# Patient Record
Sex: Female | Born: 1965 | Race: White | Hispanic: No | Smoking: Current every day smoker
Health system: Southern US, Community
[De-identification: ages and names within clinical notes are randomized; demographics above are authoritative.]

## PROBLEM LIST (undated history)

## (undated) DIAGNOSIS — G4700 Insomnia, unspecified: Secondary | ICD-10-CM

## (undated) DIAGNOSIS — I10 Essential (primary) hypertension: Secondary | ICD-10-CM

## (undated) DIAGNOSIS — F4310 Post-traumatic stress disorder, unspecified: Secondary | ICD-10-CM

## (undated) DIAGNOSIS — F603 Borderline personality disorder: Secondary | ICD-10-CM

## (undated) DIAGNOSIS — F419 Anxiety disorder, unspecified: Secondary | ICD-10-CM

## (undated) DIAGNOSIS — J45909 Unspecified asthma, uncomplicated: Secondary | ICD-10-CM

## (undated) DIAGNOSIS — F1910 Other psychoactive substance abuse, uncomplicated: Secondary | ICD-10-CM

## (undated) DIAGNOSIS — B1920 Unspecified viral hepatitis C without hepatic coma: Secondary | ICD-10-CM

## (undated) DIAGNOSIS — F319 Bipolar disorder, unspecified: Secondary | ICD-10-CM

## (undated) HISTORY — PX: TUBAL LIGATION: SHX77

## (undated) HISTORY — DX: Essential (primary) hypertension: I10

---

## 1998-09-24 ENCOUNTER — Emergency Department (HOSPITAL_COMMUNITY): Admission: EM | Admit: 1998-09-24 | Discharge: 1998-09-24 | Payer: Self-pay | Admitting: Emergency Medicine

## 1999-01-18 ENCOUNTER — Emergency Department (HOSPITAL_COMMUNITY): Admission: EM | Admit: 1999-01-18 | Discharge: 1999-01-18 | Payer: Self-pay | Admitting: Emergency Medicine

## 1999-04-10 ENCOUNTER — Emergency Department (HOSPITAL_COMMUNITY): Admission: EM | Admit: 1999-04-10 | Discharge: 1999-04-10 | Payer: Self-pay | Admitting: Emergency Medicine

## 1999-04-12 ENCOUNTER — Emergency Department (HOSPITAL_COMMUNITY): Admission: EM | Admit: 1999-04-12 | Discharge: 1999-04-12 | Payer: Self-pay | Admitting: Emergency Medicine

## 1999-07-11 ENCOUNTER — Emergency Department (HOSPITAL_COMMUNITY): Admission: EM | Admit: 1999-07-11 | Discharge: 1999-07-11 | Payer: Self-pay | Admitting: Emergency Medicine

## 2000-10-28 ENCOUNTER — Emergency Department (HOSPITAL_COMMUNITY): Admission: EM | Admit: 2000-10-28 | Discharge: 2000-10-28 | Payer: Self-pay | Admitting: Emergency Medicine

## 2001-07-24 ENCOUNTER — Other Ambulatory Visit: Admission: RE | Admit: 2001-07-24 | Discharge: 2001-07-24 | Payer: Self-pay | Admitting: Obstetrics and Gynecology

## 2001-11-15 ENCOUNTER — Emergency Department (HOSPITAL_COMMUNITY): Admission: EM | Admit: 2001-11-15 | Discharge: 2001-11-15 | Payer: Self-pay | Admitting: Emergency Medicine

## 2001-11-19 ENCOUNTER — Emergency Department (HOSPITAL_COMMUNITY): Admission: EM | Admit: 2001-11-19 | Discharge: 2001-11-19 | Payer: Self-pay | Admitting: Emergency Medicine

## 2001-11-19 ENCOUNTER — Encounter: Payer: Self-pay | Admitting: Emergency Medicine

## 2003-06-06 ENCOUNTER — Emergency Department (HOSPITAL_COMMUNITY): Admission: EM | Admit: 2003-06-06 | Discharge: 2003-06-06 | Payer: Self-pay

## 2003-10-23 ENCOUNTER — Emergency Department (HOSPITAL_COMMUNITY): Admission: EM | Admit: 2003-10-23 | Discharge: 2003-10-23 | Payer: Self-pay | Admitting: Internal Medicine

## 2006-05-12 ENCOUNTER — Emergency Department (HOSPITAL_COMMUNITY): Admission: EM | Admit: 2006-05-12 | Discharge: 2006-05-12 | Payer: Self-pay | Admitting: *Deleted

## 2011-04-25 ENCOUNTER — Emergency Department (HOSPITAL_COMMUNITY): Payer: Self-pay

## 2011-04-25 ENCOUNTER — Emergency Department (HOSPITAL_COMMUNITY)
Admission: EM | Admit: 2011-04-25 | Discharge: 2011-04-25 | Disposition: A | Discharge status: Home / Self Care | Payer: No Typology Code available for payment source | Attending: Emergency Medicine | Admitting: Emergency Medicine

## 2011-04-25 DIAGNOSIS — M546 Pain in thoracic spine: Secondary | ICD-10-CM | POA: Insufficient documentation

## 2011-04-25 DIAGNOSIS — M25559 Pain in unspecified hip: Secondary | ICD-10-CM | POA: Insufficient documentation

## 2011-04-25 DIAGNOSIS — S0990XA Unspecified injury of head, initial encounter: Secondary | ICD-10-CM | POA: Insufficient documentation

## 2011-04-25 DIAGNOSIS — M545 Low back pain, unspecified: Secondary | ICD-10-CM | POA: Insufficient documentation

## 2011-04-25 DIAGNOSIS — S46909A Unspecified injury of unspecified muscle, fascia and tendon at shoulder and upper arm level, unspecified arm, initial encounter: Secondary | ICD-10-CM | POA: Insufficient documentation

## 2011-04-25 DIAGNOSIS — M542 Cervicalgia: Secondary | ICD-10-CM | POA: Insufficient documentation

## 2011-04-25 DIAGNOSIS — M25519 Pain in unspecified shoulder: Secondary | ICD-10-CM | POA: Insufficient documentation

## 2011-04-25 DIAGNOSIS — IMO0002 Reserved for concepts with insufficient information to code with codable children: Secondary | ICD-10-CM | POA: Insufficient documentation

## 2011-04-25 DIAGNOSIS — S4980XA Other specified injuries of shoulder and upper arm, unspecified arm, initial encounter: Secondary | ICD-10-CM | POA: Insufficient documentation

## 2011-04-25 LAB — CBC
HCT: 37.1 % (ref 36.0–46.0)
Hemoglobin: 12.5 g/dL (ref 12.0–15.0)
MCH: 29.9 pg (ref 26.0–34.0)
MCV: 88.8 fL (ref 78.0–100.0)
Platelets: 283 10*3/uL (ref 150–400)
WBC: 9.5 10*3/uL (ref 4.0–10.5)

## 2011-04-25 LAB — DIFFERENTIAL
Basophils Absolute: 0.1 10*3/uL (ref 0.0–0.1)
Eosinophils Absolute: 0.5 10*3/uL (ref 0.0–0.7)
Lymphocytes Relative: 17 % (ref 12–46)
Lymphs Abs: 1.6 10*3/uL (ref 0.7–4.0)
Neutrophils Relative %: 71 % (ref 43–77)

## 2011-04-25 LAB — POCT PREGNANCY, URINE: Preg Test, Ur: NEGATIVE

## 2011-04-25 LAB — RAPID URINE DRUG SCREEN, HOSP PERFORMED: Tetrahydrocannabinol: NOT DETECTED

## 2011-04-25 LAB — BASIC METABOLIC PANEL
BUN: 17 mg/dL (ref 6–23)
Calcium: 9 mg/dL (ref 8.4–10.5)
Chloride: 104 mEq/L (ref 96–112)
Creatinine, Ser: 0.88 mg/dL (ref 0.4–1.2)
GFR calc Af Amer: >60 mL/min (ref >60)
GFR calc non Af Amer: >60 mL/min (ref >60)
Potassium: 3.7 mEq/L (ref 3.5–5.1)

## 2011-04-25 LAB — ETHANOL: Alcohol, Ethyl (B): <11 mg/dL — ABNORMAL HIGH (ref 0–10)

## 2012-06-07 ENCOUNTER — Emergency Department (HOSPITAL_COMMUNITY)
Admission: EM | Admit: 2012-06-07 | Discharge: 2012-06-07 | Disposition: A | Discharge status: Home / Self Care | Payer: Self-pay | Attending: Emergency Medicine | Admitting: Emergency Medicine

## 2012-06-07 ENCOUNTER — Encounter (HOSPITAL_COMMUNITY): Payer: Self-pay | Admitting: Emergency Medicine

## 2012-06-07 DIAGNOSIS — G4700 Insomnia, unspecified: Secondary | ICD-10-CM | POA: Insufficient documentation

## 2012-06-07 DIAGNOSIS — R21 Rash and other nonspecific skin eruption: Secondary | ICD-10-CM | POA: Insufficient documentation

## 2012-06-07 DIAGNOSIS — F4310 Post-traumatic stress disorder, unspecified: Secondary | ICD-10-CM | POA: Insufficient documentation

## 2012-06-07 DIAGNOSIS — F17200 Nicotine dependence, unspecified, uncomplicated: Secondary | ICD-10-CM | POA: Insufficient documentation

## 2012-06-07 HISTORY — DX: Post-traumatic stress disorder, unspecified: F43.10

## 2012-06-07 HISTORY — DX: Insomnia, unspecified: G47.00

## 2012-06-07 HISTORY — DX: Borderline personality disorder: F60.3

## 2012-06-07 NOTE — ED Notes (Signed)
Pt states stepped on broken light bulb a few weeks ago. Felt that she got all of it out. Pt states blister formed and she popped it. Pt states today has tiny bumps around area and starting on her left great toe as well as on her other foot. Denies pain or itching.

## 2012-06-07 NOTE — ED Notes (Signed)
Patient with no complaints at this time. Respirations even and unlabored. Skin warm/dry. Discharge instructions reviewed with patient at this time. Patient given opportunity to voice concerns/ask questions. Patient discharged at this time and left Emergency Department with steady gait.   

## 2012-06-07 NOTE — ED Provider Notes (Signed)
History   This chart was scribed for Joya Gaskins, MD by Clarita Crane. The patient was seen in room APA17/APA17. Patient's care was started at 0724.    CSN: [id number]  Arrival date & time 06/07/12  4098   First MD Initiated Contact with Patient 06/07/12 [phone]      Chief Complaint  Patient presents with  . Blister  . Rash   HPI Kelly Kelley is a 46 y.o. female who presents to the Emergency Department complaining of multiple moderate blisters to sole of left foot onset several weeks ago and persistent since. Patient notes first blister appeared several days after stepping on a piece of glass from a broken light bulb with a white powder on the inside surface of the bulb and several similar blisters have formed since. Patient states glass was removed immediately after stepping upon it. Denies nausea, vomiting, fever, chills, weakness, chest pain, SOB  Past Medical History  Diagnosis Date  . PTSD (post-traumatic stress disorder)   . Insomnia   . Borderline personality disorder     Past Surgical History  Procedure Date  . Tubal ligation   . Cesarean section     History reviewed. No pertinent family history.  History  Substance Use Topics  . Smoking status: Current Everyday Smoker -- 1.0 packs/day  . Smokeless tobacco: Not on file  . Alcohol Use: Yes     daily until three days ago. pt reports 6 beers a day    OB History    Grav Para Term Preterm Abortions TAB SAB Ect Mult Living                  Review of Systems  Constitutional: Negative for fever and chills.  Gastrointestinal: Negative for nausea and vomiting.  Skin: Positive for rash. Negative for color change.       +blisters to sole of left foot    Allergies  Penicillins  Home Medications  No current outpatient prescriptions on file.  LMP 06/07/2012  BP 145/94  Pulse 92  Temp 98.1 F (36.7 C) (Oral)  Resp 18  SpO2 99%  LMP 06/07/2012   Physical Exam CONSTITUTIONAL: Well developed/well  nourished HEAD AND FACE: Normocephalic/atraumatic EYES: EOMI/PERRL ENMT: Mucous membranes moist NECK: supple no meningeal signs CV: S1/S2 noted, no murmurs/rubs/gallops noted LUNGS: Lungs are clear to auscultation bilaterally, no apparent distress ABDOMEN: soft, nontender, no rebound or guarding NEURO: Pt is awake/alert, moves all extremitiesx4 EXTREMITIES: pulses normal, full ROM SKIN: warm, color normal, few scattered papules to sole of left foot with no increased warmth or erythema noted No crepitance.  No streaking.  No petechiae noted PSYCH: no abnormalities of mood noted  ED Course  Procedures  DIAGNOSTIC STUDIES:  COORDINATION OF CARE: 7:37AM-Patient informed of current plan for treatment and evaluation and agrees with plan at this time.  No signs of retained glass in foot Pt is ambulatory without difficulty She is well appearing Advised derm followup if not improved with OTC topical meds     1. Rash       MDM  Nursing notes including past medical history and social history reviewed and considered in documentation .      I personally performed the services described in this documentation, which was scribed in my presence. The recorded information has been reviewed and considered.      Joya Gaskins, MD 06/07/12 [phone]

## 2015-03-18 ENCOUNTER — Ambulatory Visit: Payer: Self-pay | Attending: Internal Medicine

## 2015-07-22 ENCOUNTER — Emergency Department (HOSPITAL_BASED_OUTPATIENT_CLINIC_OR_DEPARTMENT_OTHER)
Admission: EM | Admit: 2015-07-22 | Discharge: 2015-07-22 | Disposition: A | Discharge status: Home / Self Care | Payer: Self-pay | Attending: Physician Assistant | Admitting: Physician Assistant

## 2015-07-22 ENCOUNTER — Encounter (HOSPITAL_BASED_OUTPATIENT_CLINIC_OR_DEPARTMENT_OTHER): Payer: Self-pay

## 2015-07-22 ENCOUNTER — Emergency Department (HOSPITAL_BASED_OUTPATIENT_CLINIC_OR_DEPARTMENT_OTHER): Payer: Self-pay

## 2015-07-22 DIAGNOSIS — F17200 Nicotine dependence, unspecified, uncomplicated: Secondary | ICD-10-CM

## 2015-07-22 DIAGNOSIS — F603 Borderline personality disorder: Secondary | ICD-10-CM | POA: Insufficient documentation

## 2015-07-22 DIAGNOSIS — G4700 Insomnia, unspecified: Secondary | ICD-10-CM | POA: Insufficient documentation

## 2015-07-22 DIAGNOSIS — R0602 Shortness of breath: Secondary | ICD-10-CM | POA: Insufficient documentation

## 2015-07-22 DIAGNOSIS — I10 Essential (primary) hypertension: Secondary | ICD-10-CM | POA: Insufficient documentation

## 2015-07-22 DIAGNOSIS — Z720 Tobacco use: Secondary | ICD-10-CM | POA: Insufficient documentation

## 2015-07-22 DIAGNOSIS — F4310 Post-traumatic stress disorder, unspecified: Secondary | ICD-10-CM | POA: Insufficient documentation

## 2015-07-22 DIAGNOSIS — F319 Bipolar disorder, unspecified: Secondary | ICD-10-CM | POA: Insufficient documentation

## 2015-07-22 HISTORY — DX: Bipolar disorder, unspecified: F31.9

## 2015-07-22 HISTORY — DX: Other psychoactive substance abuse, uncomplicated: F19.10

## 2015-07-22 HISTORY — DX: Anxiety disorder, unspecified: F41.9

## 2015-07-22 LAB — BASIC METABOLIC PANEL
ANION GAP: 8 (ref 5–15)
BUN: 12 mg/dL (ref 6–20)
CALCIUM: 9.3 mg/dL (ref 8.9–10.3)
CHLORIDE: 106 mmol/L (ref 101–111)
CO2: 27 mmol/L (ref 22–32)
Creatinine, Ser: 0.86 mg/dL (ref 0.44–1.00)
GFR calc Af Amer: >60 mL/min (ref >60)
GLUCOSE: 136 mg/dL — AB (ref 65–99)
POTASSIUM: 3.6 mmol/L (ref 3.5–5.1)
Sodium: 141 mmol/L (ref 135–145)

## 2015-07-22 LAB — CBC WITH DIFFERENTIAL/PLATELET
BASOS ABS: 0 10*3/uL (ref 0.0–0.1)
Basophils Relative: 0 % (ref 0–1)
Eosinophils Absolute: 0.3 10*3/uL (ref 0.0–0.7)
Eosinophils Relative: 4 % (ref 0–5)
HEMATOCRIT: 39.1 % (ref 36.0–46.0)
HEMOGLOBIN: 13.8 g/dL (ref 12.0–15.0)
LYMPHS PCT: 26 % (ref 12–46)
Lymphs Abs: 2.2 10*3/uL (ref 0.7–4.0)
MCH: 30.4 pg (ref 26.0–34.0)
MCHC: 35.3 g/dL (ref 30.0–36.0)
MCV: 86.1 fL (ref 78.0–100.0)
Monocytes Absolute: 0.5 10*3/uL (ref 0.1–1.0)
Monocytes Relative: 6 % (ref 3–12)
Neutro Abs: 5.4 10*3/uL (ref 1.7–7.7)
Neutrophils Relative %: 64 % (ref 43–77)
Platelets: 195 10*3/uL (ref 150–400)
RBC: 4.54 MIL/uL (ref 3.87–5.11)
RDW: 12.8 % (ref 11.5–15.5)
WBC: 8.5 10*3/uL (ref 4.0–10.5)

## 2015-07-22 LAB — URINALYSIS, ROUTINE W REFLEX MICROSCOPIC
BILIRUBIN URINE: NEGATIVE
Glucose, UA: NEGATIVE mg/dL
Hgb urine dipstick: NEGATIVE
KETONES UR: NEGATIVE mg/dL
LEUKOCYTES UA: NEGATIVE
Nitrite: NEGATIVE
PH: 6.5 (ref 5.0–8.0)
Protein, ur: NEGATIVE mg/dL
SPECIFIC GRAVITY, URINE: 1.005 (ref 1.005–1.030)
UROBILINOGEN UA: 0.2 mg/dL (ref 0.0–1.0)

## 2015-07-22 MED ORDER — HYDROCHLOROTHIAZIDE 12.5 MG PO CAPS: 12.5000 mg | ORAL_CAPSULE | Freq: Every day | ORAL | Status: DC

## 2015-07-22 NOTE — ED Notes (Signed)
Right arm was 168/120. Checked both arms due to reading.

## 2015-07-22 NOTE — ED Notes (Signed)
Pa  at bedside. 

## 2015-07-22 NOTE — ED Notes (Signed)
Pt sent from [facility] for elevated BP-Day one at [facility] for cocaine use-written report reads 157/121 at 1245pm and 169/111 at 1145am-pt NAD-denies pain except "carpal tunnel"

## 2015-07-22 NOTE — ED Provider Notes (Signed)
CSN: [id number]     Arrival date & time 07/22/15  1303 History   First MD Initiated Contact with Patient 07/22/15 1317     Chief Complaint  Patient presents with  . Hypertension     (Consider location/radiation/quality/duration/timing/severity/associated sxs/prior Treatment) HPI   Blood pressure 160/100, pulse 78, temperature 98.7 F (37.1 C), temperature source Oral, resp. rate 18, height 5' (1.524 m), weight 149 lb (67.586 kg), SpO2 98 %.  Kelly Kelley is a 49 y.o. female sent from [place] for evaluated of elevated blood pressure. Patient last used cocaine on June 15. She states that she has a dyspnea on exertion with no chest pain, cough, fever, chills, headache, change in vision, dysarthria, ataxia, abdominal pain, nausea, vomiting or change in bowel or bladder habits. Patient is uninsured with no primary care physician should never had a diagnosis of high blood pressure however she states that it runs in her family.  Past Medical History  Diagnosis Date  . PTSD (post-traumatic stress disorder)   . Insomnia   . Borderline personality disorder   . Substance abuse   . Bipolar disorder   . Anxiety    Past Surgical History  Procedure Laterality Date  . Tubal ligation    . Cesarean section     No family history on file. History  Substance Use Topics  . Smoking status: Current Every Day Smoker -- 1.00 packs/day  . Smokeless tobacco: Not on file  . Alcohol Use: Yes     Comment: in rehab   OB History    No data available     Review of Systems  10 systems reviewed and found to be negative, except as noted in the HPI.  Allergies  Morphine and related and Penicillins  Home Medications   Prior to Admission medications   Medication Sig Start Date End Date Taking? Authorizing Provider  QUEtiapine Fumarate (SEROQUEL PO) Take by mouth.   Yes Historical Provider, MD   BP 164/107 mmHg  Pulse 78  Temp(Src) 98.7 F (37.1 C) (Oral)  Resp 18  Ht 5' (1.524 m)  Wt 149 lb  (67.586 kg)  BMI 29.10 kg/m2  SpO2 98% Physical Exam  Constitutional: She is oriented to person, place, and time. She appears well-developed and well-nourished. No distress.  HENT:  Head: Normocephalic.  Mouth/Throat: Oropharynx is clear and moist.  Eyes: Conjunctivae and EOM are normal. Pupils are equal, round, and reactive to light.  Neck: Normal range of motion. No JVD present. No tracheal deviation present.  Cardiovascular: Normal rate, regular rhythm and intact distal pulses.   Radial pulse equal bilaterally  Pulmonary/Chest: Effort normal and breath sounds normal. No stridor. No respiratory distress. She has no wheezes. She has no rales. She exhibits no tenderness.  Abdominal: Soft. She exhibits no distension and no mass. There is no tenderness. There is no rebound and no guarding.  Musculoskeletal: Normal range of motion. She exhibits no edema or tenderness.  No calf asymmetry, superficial collaterals, palpable cords, edema, Homans sign negative bilaterally.    Neurological: She is alert and oriented to person, place, and time.  Skin: Skin is warm. She is not diaphoretic.  Psychiatric: She has a normal mood and affect.  Nursing note and vitals reviewed.   ED Course  Procedures (including critical care time) Labs Review Labs Reviewed - No data to display  Imaging Review No results found.   EKG Interpretation None      MDM   Final diagnoses:  SOB (shortness of  breath)  Tobacco use disorder  HTN (hypertension) with goal to be determined    Filed Vitals:   07/22/15 1309 07/22/15 1322  BP: 164/107 160/100  Pulse: 78   Temp: 98.7 F (37.1 C)   TempSrc: Oral   Resp: 18   Height: 5' (1.524 m)   Weight: 149 lb (67.586 kg)   SpO2: 98%      Kelly Kelley is a pleasant 49 y.o. female presenting with a symptom at hypertension. Patient states she short of breath but I think this is just secondary to her from smoking. Because patient is uninsured, without  follow-up, will do basic blood work, chest check EKG and chest x-ray and start her on hypertension medications, we've had an extensive discussion patient understands she will have to follow-up for blood pressure recheck in the next month. Blood work, chest x-ray and EKG without acute abnormality.  I've given her a resource guide as well. We've had extensive discussion of return precautions and I've counseled her on smoking cessation.  Evaluation does not show pathology that would require ongoing emergent intervention or inpatient treatment. Pt is hemodynamically stable and mentating appropriately. Discussed findings and plan with patient/guardian, who agrees with care plan. All questions answered. Return precautions discussed and outpatient follow up given.   Discharge Medication List as of 07/22/2015  2:26 PM    START taking these medications   Details  hydrochlorothiazide (MICROZIDE) 12.5 MG capsule Take 1 capsule (12.5 mg total) by mouth daily., Starting 07/22/2015, Until Discontinued, Print             Wynetta Emery, PA-C 07/22/15 1508  Courteney Randall An, MD 07/22/15 1536

## 2015-07-22 NOTE — Discharge Instructions (Signed)
Do not hesitate to return to the emergency room for any new, worsening or concerning symptoms. ° °Please obtain primary care using resource guide below. Let them know that you were seen in the emergency room and that they will need to obtain records for further outpatient management. ° ° ° °Emergency Department Resource Guide °1) Find a Doctor and Pay Out of Pocket °Although you won't have to find out who is covered by your insurance plan, it is a good idea to ask around and get recommendations. You will then need to call the office and see if the doctor you have chosen will accept you as a new patient and what types of options they offer for patients who are self-pay. Some doctors offer discounts or will set up payment plans for their patients who do not have insurance, but you will need to ask so you aren't surprised when you get to your appointment. ° °2) Contact Your Local Health Department °Not all health departments have doctors that can see patients for sick visits, but many do, so it is worth a call to see if yours does. If you don't know where your local health department is, you can check in your phone book. The CDC also has a tool to help you locate your state's health department, and many state websites also have listings of all of their local health departments. ° °3) Find a Walk-in Clinic °If your illness is not likely to be very severe or complicated, you may want to try a walk in clinic. These are popping up all over the country in pharmacies, drugstores, and shopping centers. They're usually staffed by nurse practitioners or physician assistants that have been trained to treat common illnesses and complaints. They're usually fairly quick and inexpensive. However, if you have serious medical issues or chronic medical problems, these are probably not your best option. ° °No Primary Care Doctor: °- Call Health Connect at  832-8000 - they can help you locate a primary care doctor that  accepts your  insurance, provides certain services, etc. °- Physician Referral Service- 1-800-533-3463 ° °Chronic Pain Problems: °Organization         Address  Phone   Notes  °[facility]  (336) 297-2271 Patients need to be referred by their primary care doctor.  ° °Medication Assistance: °Organization         Address  Phone   Notes  °[place] [facility] [address] °[place], [place] [address] (336) 641-8030 --Must be a resident of [place] °-- Must have NO insurance coverage whatsoever (no Medicaid/ Medicare, etc.) °-- The pt. MUST have a primary care doctor that directs their care regularly and follows them in the community °  °[facility]  (866) 331-1348   °[employer] Way  (888) 892-1162   ° °Agencies that provide inexpensive medical care: °Organization         Address  Phone   Notes  °[facility]  (336) 832-8035   °[facility]    (336) 832-7272   °[facility] [address] °[place], [place] [address] (336) 832-4777   °[facility] [address], °[place] (336) 271-4999   °[facility]    (336) 373-0678   °[facility]    (336) 272-1050   °[facility] ° [address],  Phone:  (336) 832-4444, Fax:  (336) 832-4440 Hours of Operation:  9 am -   6 pm, M-F.  Also accepts Medicaid/Medicare and self-pay.  °[facility] ° [address], [place] Phone: (336) 832-3150, Fax: (336) 832-3151. Hours of Operation:  8:30 am - 5:30 pm, M-F.  Also accepts Medicaid and self-pay.  °[facility] [place] [address], [place] Phone: (336) 878-6027   °[facility] [address], [place], [place] (336)723-1848, Ext. 123 Mondays & Thursdays: 7-9 AM.  First 15 patients are seen on a first come, first serve basis. °  ° °Medicaid-accepting [place] Providers: ° °Organization          Address  Phone   Notes  °[facility] [address], [place] (336) 641-2100 Also accepts self-pay patients.  °[facility] [address], [place] ° (336) 856-9996   °[facility] [address], [place] (336) 288-8857   °[facility] [address] [place] [address], [place] (336) 299-7000   °Veita Bland [address], [place]  ° (336) 373-1557 Only accepts [place] Access Medicaid patients after they have their name applied to their card.  ° °Self-Pay (no insurance) in [place]: ° °Organization         Address  Phone   Notes  °Sickle Cell Patients, [facility] [address], [place] (336) 832-1970   °[facility] [address], [place] (336) 832-4400   °[facility] [place] ° [address], [place] (336) 992-4800   °[facility]/Dr. Osei-Bonsu ° [address] [place] [address], [place] or [address], [place] (336) 841-8500 Phone number for both [place] and [place] locations is the same.  °[facility] [address], [place] (336) 299-0000   °[facility] [address] [place] [address], [place] or [address] (336) 852-7530 °(336) 878-2260   °[facility] [address], [place] (336) 350-1642, phone; (336) 294-5005, fax Sees patients 1st and 3rd Saturday of every month.  Must not qualify for public or private insurance (i.e. Medicaid, Medicare, [place] Health Choice, Veterans' Benefits) • Household income should be no more than 200% of the poverty level •The clinic cannot treat you if you are pregnant or think you are pregnant • Sexually transmitted diseases are not treated at the clinic.  ° ° °Dental Care: °Organization         Address  Phone  Notes  °[facility] [address],  [place] (336) 641-6152 Accepts children up to age 21 who are enrolled in Medicaid or [place] Health Choice; pregnant women with a Medicaid card; and children who have applied for Medicaid or [place] Health Choice, but were declined, whose parents can pay a reduced fee at time of service.  °[facility] [place]  [address], [place] (336) 641-7733 Accepts children up to age 21 who are enrolled in Medicaid or [place] Health Choice; pregnant women with a Medicaid card; and children who have applied for Medicaid or [place] Health Choice, but were declined, whose parents can pay a reduced fee at time of service.  °[facility] ° [address],  (336) 641-4533 Patients are seen by appointment only. Walk-ins are not accepted. [facility] will see patients 18 years of age and   older. °Monday - Tuesday (8am-5pm) °Most Wednesdays (8:30-5pm) °$30 per visit, cash only  °[facility] ° [address], [place] (336) 641-4533 Patients are seen by appointment only. Walk-ins are not accepted. [facility] will see patients 18 years of age and older. °One Wednesday Evening (Monthly: Volunteer Based).  $30 per visit, cash only  °[facility]  (919) 537-3737 for adults; Children under age 4, call Graduate Pediatric Dentistry at (919) 537-3956. Children aged 4-14, please call (919) 537-3737 to request a pediatric application. ° Dental services are provided in all areas of dental care including fillings, crowns and bridges, complete and partial dentures, implants, gum treatment, root canals, and extractions. Preventive care is also provided. Treatment is provided to both adults and children. °Patients are selected via a lottery and there is often a waiting list. °  °[facility] [address], °[place] ° (336) 763-8833 [email or web address] °  °[facility] [address], [place], [place]  (336)723-1848, Ext. 123 Second and Fourth Thursday of each month, opens at 6:30 AM; Clinic ends at 9 AM.  Patients are seen on a first-come first-served basis, and a limited number are seen during each clinic.  ° °[facility] ° [address], [place], [place] (336) 723-7904   Eligibility Requirements °You must have lived in [place], [place], or [place] counties for at least the last three months. °  You cannot be eligible for state or federal sponsored healthcare insurance, including Veterans Administration, Medicaid, or Medicare. °  You generally cannot be eligible for healthcare insurance through your employer.  °  How to apply: °Eligibility screenings are held every Tuesday and Wednesday afternoon from 1:00 pm until 4:00 pm. You do not need an appointment for the interview!  °[facility] [address], [place], [place] 336-631-2330   °[facility] [facility]  336-342-8273   °[facility]  336-703-3100   °[facility]  336-570-6415   ° °Behavioral Health Resources in the Community: °Intensive Outpatient Programs °Organization         Address  Phone  Notes  °[facility] [address], [place], [place] 336-878-6098   °[facility] [address], [place], [place] 336-832-9800   °[facility] [address], [place], [place] ° 336-882-2125   °[facility] [address],  °[place], [place] 1-800-853-5163 or 336-641-4981   °Substance Abuse Resources °Organization         Address  Phone  Notes  °[facility]  336-882-2125   °[facility]  336-784-9470   °[facility]  336-285-9073   °[facility]  336-845-3988   °Residential & Outpatient Substance Abuse Program  1-800-659-3381   °Psychological Services °Organization         Address  Phone  Notes  °[facility]  336- 832-9600   °[facility]  336- 378-7881    °[facility] [address], [place] 1-800-853-5163 or 336-641-4981   ° °Mobile Crisis Teams °Organization         Address  Phone  Notes  °[facility], Mobile Crisis Care Unit  1-877-626-1772   °Assertive °[facility] ° [address] [place], [place] 336-834-9664   °Kaile DeEsch [address] °[place] 336-554-5454   ° °Self-Help/Support Groups °Organization         Address    Phone             Notes  °[facility] - variety of support groups  336- 373-1402 Call for more information  °Narcotics Anonymous (NA), [facility] [address], °[place]  2 meetings at this location  ° °Residential Treatment Programs °Organization         Address  Phone  Notes  °[facility] [address],    °[place]  1-866-801-8205   °[facility] ° [address], [place], [place] 704-293-8524   °[facility] [address], [place] 336-845-3988 Admissions: 8am-3pm M-F  °[facility] [address]    °[place], [place] 336-841-1104   °[facility] [address], [place], [place] 336-379-7146   °[facility] [address]  °[place], [place] 336-285-9073   °[facility] Intensive Outpatient [address], [place], [place] 336-852-3033   °[facility] [address]  °[place], [place] 1-877-615-2722 or 336-784-9470   °[facility] [address] [place], [place] 336-227-7417 Accepts Medicaid  °[facility] [address]  °[place] 1-800-659-3381 Substance Abuse/Addiction Treatment  ° °[place] Behavioral Health Resources °Organization         Address  Phone  Notes  °[facility]  (888) 581-9988   °Julie Brannon, PhD [address] [place], [place]   (336) 349-5553 or (336) 951-0000   °[facility]   [address] °[place], [place] (336) 349-4454   °[facility] [address], [place], [place] (336) 342-8316 Insurance/Medicaid/sponsorship through [facility]  °[facility] [address]                                    [place], [place] (336) 342-8316 Therapy/tele-psych/case  °[facility] [address]  ° [place], [place] (336) 349-2233    °Dr. Arfeen  (336) 349-4544   °[facility] [place]  [employer] [place] [facility] 1) [address], [place] °2) [address], [place] °3)  [address], [place] (336) 349-3220 °(336) 342-7768 ° °(336) 342-8140   °[place] [facility] (336) 342-1394 or (336) 342-3537 (After Hours)    ° ° ° °

## 2015-12-14 ENCOUNTER — Emergency Department (HOSPITAL_BASED_OUTPATIENT_CLINIC_OR_DEPARTMENT_OTHER)
Admission: EM | Admit: 2015-12-14 | Discharge: 2015-12-14 | Disposition: A | Discharge status: Home / Self Care | Payer: Self-pay | Attending: Emergency Medicine | Admitting: Emergency Medicine

## 2015-12-14 ENCOUNTER — Encounter (HOSPITAL_BASED_OUTPATIENT_CLINIC_OR_DEPARTMENT_OTHER): Payer: Self-pay | Admitting: Emergency Medicine

## 2015-12-14 ENCOUNTER — Emergency Department (HOSPITAL_BASED_OUTPATIENT_CLINIC_OR_DEPARTMENT_OTHER): Payer: Self-pay

## 2015-12-14 DIAGNOSIS — Z3202 Encounter for pregnancy test, result negative: Secondary | ICD-10-CM | POA: Insufficient documentation

## 2015-12-14 DIAGNOSIS — F17200 Nicotine dependence, unspecified, uncomplicated: Secondary | ICD-10-CM | POA: Insufficient documentation

## 2015-12-14 DIAGNOSIS — F319 Bipolar disorder, unspecified: Secondary | ICD-10-CM | POA: Insufficient documentation

## 2015-12-14 DIAGNOSIS — F4310 Post-traumatic stress disorder, unspecified: Secondary | ICD-10-CM | POA: Insufficient documentation

## 2015-12-14 DIAGNOSIS — F603 Borderline personality disorder: Secondary | ICD-10-CM | POA: Insufficient documentation

## 2015-12-14 DIAGNOSIS — F419 Anxiety disorder, unspecified: Secondary | ICD-10-CM | POA: Insufficient documentation

## 2015-12-14 DIAGNOSIS — K029 Dental caries, unspecified: Secondary | ICD-10-CM | POA: Insufficient documentation

## 2015-12-14 DIAGNOSIS — G4700 Insomnia, unspecified: Secondary | ICD-10-CM | POA: Insufficient documentation

## 2015-12-14 DIAGNOSIS — J45909 Unspecified asthma, uncomplicated: Secondary | ICD-10-CM | POA: Insufficient documentation

## 2015-12-14 DIAGNOSIS — L03211 Cellulitis of face: Secondary | ICD-10-CM | POA: Insufficient documentation

## 2015-12-14 DIAGNOSIS — Z880 Allergy status to penicillin: Secondary | ICD-10-CM | POA: Insufficient documentation

## 2015-12-14 HISTORY — DX: Unspecified asthma, uncomplicated: J45.909

## 2015-12-14 LAB — CBC WITH DIFFERENTIAL/PLATELET
Basophils Absolute: 0 10*3/uL (ref 0.0–0.1)
Basophils Relative: 0 %
Eosinophils Absolute: 0.1 10*3/uL (ref 0.0–0.7)
Eosinophils Relative: 1 %
HCT: 39.8 % (ref 36.0–46.0)
HEMOGLOBIN: 13.5 g/dL (ref 12.0–15.0)
LYMPHS ABS: 0.7 10*3/uL (ref 0.7–4.0)
LYMPHS PCT: 6 %
MCH: 29.3 pg (ref 26.0–34.0)
MCHC: 33.9 g/dL (ref 30.0–36.0)
MCV: 86.3 fL (ref 78.0–100.0)
Monocytes Absolute: 0.4 10*3/uL (ref 0.1–1.0)
Monocytes Relative: 3 %
NEUTROS ABS: 11.5 10*3/uL — AB (ref 1.7–7.7)
NEUTROS PCT: 90 %
Platelets: 168 10*3/uL (ref 150–400)
RBC: 4.61 MIL/uL (ref 3.87–5.11)
RDW: 12.9 % (ref 11.5–15.5)
WBC: 12.8 10*3/uL — AB (ref 4.0–10.5)

## 2015-12-14 LAB — BASIC METABOLIC PANEL
Anion gap: 7 (ref 5–15)
BUN: 22 mg/dL — AB (ref 6–20)
CHLORIDE: 103 mmol/L (ref 101–111)
CO2: 26 mmol/L (ref 22–32)
Calcium: 8.8 mg/dL — ABNORMAL LOW (ref 8.9–10.3)
Creatinine, Ser: 0.95 mg/dL (ref 0.44–1.00)
GFR calc Af Amer: >60 mL/min (ref >60)
GFR calc non Af Amer: >60 mL/min (ref >60)
GLUCOSE: 127 mg/dL — AB (ref 65–99)
POTASSIUM: 3.6 mmol/L (ref 3.5–5.1)
SODIUM: 136 mmol/L (ref 135–145)

## 2015-12-14 LAB — HCG, QUANTITATIVE, PREGNANCY: HCG, BETA CHAIN, QUANT, S: 2 m[IU]/mL (ref <5)

## 2015-12-14 MED ORDER — DOXYCYCLINE HYCLATE 100 MG PO CAPS: 100.0000 mg | ORAL_CAPSULE | Freq: Two times a day (BID) | ORAL | Status: DC

## 2015-12-14 MED ORDER — CLINDAMYCIN HCL 150 MG PO CAPS: 450.0000 mg | ORAL_CAPSULE | Freq: Three times a day (TID) | ORAL | Status: DC

## 2015-12-14 MED ORDER — KETOROLAC TROMETHAMINE 60 MG/2ML IM SOLN
60.0000 mg | Freq: Once | INTRAMUSCULAR | Status: AC
  Administered 2015-12-14: 60 mg via INTRAMUSCULAR
  Filled 2015-12-14: qty 2

## 2015-12-14 MED ORDER — IOHEXOL 300 MG/ML  SOLN
75.0000 mL | Freq: Once | INTRAMUSCULAR | Status: AC | PRN
  Administered 2015-12-14: 75 mL via INTRAVENOUS

## 2015-12-14 NOTE — ED Notes (Signed)
Pt reports abscess to left side of mouth x 1 week, now presents with edema to left side of face

## 2015-12-14 NOTE — Discharge Instructions (Signed)
Take 450 mg clindamycin (3 capsule) 3 times a day for 10 days. Schedule a follow up appointment with a PCP or go to an urgent care in 2 days to recheck your facial swelling. Schedule an appointment with a dentist for as soon as possible. Use OTC pain medications for pain control.   Cellulitis Cellulitis is an infection of the skin and the tissue beneath it. The infected area is usually red and tender. Cellulitis occurs most often in the arms and lower legs.  CAUSES  Cellulitis is caused by bacteria that enter the skin through cracks or cuts in the skin. The most common types of bacteria that cause cellulitis are staphylococci and streptococci. SIGNS AND SYMPTOMS   Redness and warmth.  Swelling.  Tenderness or pain.  Fever. DIAGNOSIS  Your health care provider can usually determine what is wrong based on a physical exam. Blood tests may also be done. TREATMENT  Treatment usually involves taking an antibiotic medicine. HOME CARE INSTRUCTIONS   Take your antibiotic medicine as directed by your health care provider. Finish the antibiotic even if you start to feel better.  Keep the infected arm or leg elevated to reduce swelling.  Apply a warm cloth to the affected area up to 4 times per day to relieve pain.  Take medicines only as directed by your health care provider.  Keep all follow-up visits as directed by your health care provider. SEEK MEDICAL CARE IF:   You notice red streaks coming from the infected area.  Your red area gets larger or turns dark in color.  Your bone or joint underneath the infected area becomes painful after the skin has healed.  Your infection returns in the same area or another area.  You notice a swollen bump in the infected area.  You develop new symptoms.  You have a fever. SEEK IMMEDIATE MEDICAL CARE IF:   You feel very sleepy.  You develop vomiting or diarrhea.  You have a general ill feeling (malaise) with muscle aches and pains.     This information is not intended to replace advice given to you by your health care provider. Make sure you discuss any questions you have with your health care provider.   Document Released: 09/08/2005 Document Revised: 08/20/2015 Document Reviewed: 02/14/2012 Elsevier Interactive Patient Education 2016 [employer]   Emergency Department Resource Guide 1) Find a Doctor and Pay Out of Pocket Although you won't have to find out who is covered by your insurance plan, it is a good idea to ask around and get recommendations. You will then need to call the office and see if the doctor you have chosen will accept you as a new patient and what types of options they offer for patients who are self-pay. Some doctors offer discounts or will set up payment plans for their patients who do not have insurance, but you will need to ask so you aren't surprised when you get to your appointment.  2) Contact Your Local Health Department Not all health departments have doctors that can see patients for sick visits, but many do, so it is worth a call to see if yours does. If you don't know where your local health department is, you can check in your phone book. The CDC also has a tool to help you locate your state's health department, and many state websites also have listings of all of their local health departments.  3) Find a Walk-in Clinic If your illness is not likely to be  very severe or complicated, you may want to try a walk in clinic. These are popping up all over the country in pharmacies, drugstores, and shopping centers. They're usually staffed by nurse practitioners or physician assistants that have been trained to treat common illnesses and complaints. They're usually fairly quick and inexpensive. However, if you have serious medical issues or chronic medical problems, these are probably not your best option.  No Primary Care Doctor: - Call Health Connect at  [phone] - they can help you locate a  primary care doctor that  accepts your insurance, provides certain services, etc. - Physician Referral Service- [phone]  Chronic Pain Problems: Organization         Address  Phone   Notes  [facility]  [phone] Patients need to be referred by their primary care doctor.   Medication Assistance: Organization         Address  Phone   Notes  [facility] [address] [place], [place] [address] [phone] --Must be a resident of [facility] -- Must have NO insurance coverage whatsoever (no Medicaid/ Medicare, etc.) -- The pt. MUST have a primary care doctor that directs their care regularly and follows them in the community   [facility]  [phone]   [employer]  [phone]    Agencies that provide inexpensive medical care: Organization         Address  Phone   Notes  [place]  [phone]   [facility]    [phone]   [facility] [place], [place] [address] [phone]   [place] [address], [place] [phone]   [facility]    [phone]   [facility]    [phone]   [place] and [place] [address], [place] Phone:  [phone], Fax:  [phone] Hours of Operation:  9 am - 6 pm, M-F.  Also accepts Medicaid/Medicare and self-pay.  [facility] for [place], [address], [place] Phone: [phone], Fax: [phone]. Hours of Operation:  8:30 am - 5:30 pm, M-F.  Also accepts Medicaid and self-pay.  [facility] [address] [place] Phone: [phone]   [place], [place], [place] [phone], Ext. [phone] Mondays & Thursdays: 7-9 AM.  First 15 patients are seen on a first come, first serve basis.    [place]  Providers:  Organization         Address  Phone   Notes  [facility] [address], [place] [phone] Also accepts self-pay patients.  [facility] [address], [place]  [phone]   [place], [address], [place] [phone]   [facility] [address], [place] [phone]   Lucianne Lei [address], [place]   [phone] Only accepts [place] Access [place] patients after they have their name applied to their card.   Self-Pay (no insurance) in [facility]:  Organization         Address  Phone   Notes  Sickle Cell Patients, [facility] [place] [phone]   [facility]  [place] [phone]   [facility] [place]  [place], [address], [place] [phone]   [facility]/Dr. Osei-Bonsu  [address], [place] or [address], [place] [phone] Phone number for both [place] and [place] locations is the same.  [facility] [address] [place] [phone]   [facility] [address], [place] or [address] [phone]   [facility] [address] [place] [phone], phone; [phone], fax Sees patients 1st and 3rd Saturday of every month.  Must not qualify for public or private insurance (i.e. Medicaid, Medicare, [place] Health Choice, Veterans' Benefits)  Household income should be no more than 200% of the poverty level The clinic cannot treat you if you are pregnant or think you are pregnant  Sexually transmitted diseases are not treated at the clinic.    Dental Care: Organization         Address  Phone  Notes  [facility] Department of [facility] [phone] Accepts children up to age 38 who are enrolled in [place] or [place]; pregnant women with a Medicaid card; and children who have applied for Medicaid or [place] Health Choice, but were declined, whose parents can pay a reduced fee at time of service.  [facility] Department of [facility]  [address], [place] [phone] Accepts children up to age 6 who are enrolled in [place] or [place]; pregnant women with a Medicaid card; and children who have applied for Medicaid or [place] Health Choice, but were declined, whose parents can pay a reduced fee at time of service.  [facility]  [place] [phone] Patients are seen by appointment only. Walk-ins are not accepted. [place] will see patients 13 years of age and older. Monday - Tuesday (8am-5pm) Most Wednesdays (8:30-5pm) $30 per visit, cash only  [facility] Adult Dental Access PRO[facility]  [address], [facility] [phone] Patients are seen by appointment only. Walk-ins are not accepted. [place] will see patients 28 years of age and older. One Wednesday Evening (Monthly: Volunteer Based).  $30 per visit, cash only  [place]  [phone] for adults; Children under age 33, call Graduate Pediatric Dentistry at [phone]. Children aged 86-14, please call [phone] to request a pediatric application.  Dental services are provided in all areas of dental care including fillings, crowns and bridges, complete and partial dentures, implants, gum treatment, root canals, and extractions. Preventive care is also provided. Treatment is provided to both adults and children. Patients are selected via a lottery and there is often a waiting list.   [facility] [address], [place]  [phone] [email or web address]   [facility]  [address] [place], [place] [phone], Ext. [phone] Second and Fourth Thursday of each month, opens at 6:30 AM; Clinic ends at 9 AM.  Patients are seen on a first-come first-served basis, and a limited number are seen during each clinic.   [facility]  [address] [place], [place] [phone]   Eligibility Requirements You must have lived in [place], [place], or [place] counties for at least the last three months.  You cannot be eligible for state or federal sponsored [place], including [employer], [place], or [employer].   You generally cannot be eligible for healthcare insurance through your employer.    How to apply: Eligibility screenings are held every Tuesday and Wednesday afternoon from 1:00 pm until 4:00 pm. You do not need an appointment for the interview!  [facility] [address] [place], [place] [phone]   [facility]  [phone]   [facility]  [phone]   [facility]  [phone]

## 2015-12-14 NOTE — ED Provider Notes (Signed)
CSN: [id number]     Arrival date & time 12/14/15  1218 History   First MD Initiated Contact with Patient 12/14/15 1414     Chief Complaint  Patient presents with  . Abscess   HPI  Kelly Kelley is a 50 year old female with PMHx of PTSD, borderline personality disorder, substance abusea and bipolar presenting with facial swelling. She states that her left cheek has gradually gotten more swollen and painful over the past week. She is concerned she has "an abscess of the face because all my teeth are bad in there". She reports associated left lower dental pain over the past week. The dental pain is worsened by eating and cold water. She denies overlying erythema or other skin changes of her face. The swelling is located over the left jawline and includes the bottom lip. She denies difficulty swallowing, handling secretions or breathing. She is eating and drinking during the interview without difficulty. She denies swelling of the neck and denies feeling of throat swelling. She has not tried any over-the-counter medications for pain control. Denies fevers, chills, headache, syncope, neck pain, shortness of breath, abdominal pain, nausea or vomiting. She has no other complaints today.    Past Medical History  Diagnosis Date  . PTSD (post-traumatic stress disorder)   . Insomnia   . Borderline personality disorder   . Substance abuse   . Bipolar disorder (HCC)   . Anxiety   . Asthma    Past Surgical History  Procedure Laterality Date  . Tubal ligation    . Cesarean section     History reviewed. No pertinent family history. Social History  Substance Use Topics  . Smoking status: Current Every Day Smoker -- 1.00 packs/day  . Smokeless tobacco: None  . Alcohol Use: Yes     Comment: in rehab   OB History    No data available     Review of Systems  HENT: Positive for facial swelling.   All other systems reviewed and are negative.     Allergies  Morphine and related and  Penicillins  Home Medications   Prior to Admission medications   Medication Sig Start Date End Date Taking? Authorizing Provider  citalopram (CELEXA) 10 MG tablet Take 10 mg by mouth daily.   Yes Historical Provider, MD  furosemide (LASIX) 20 MG tablet Take 20 mg by mouth.   Yes Historical Provider, MD  gabapentin (NEURONTIN) 100 MG capsule Take 100 mg by mouth 3 (three) times daily.   Yes Historical Provider, MD  hydrOXYzine (ATARAX/VISTARIL) 50 MG tablet Take 50 mg by mouth 3 (three) times daily as needed.   Yes Historical Provider, MD  lamoTRIgine (LAMICTAL) 25 MG tablet Take 25 mg by mouth daily.   Yes Historical Provider, MD  lisinopril (PRINIVIL,ZESTRIL) 10 MG tablet Take 10 mg by mouth daily.   Yes Historical Provider, MD  meloxicam (MOBIC) 15 MG tablet Take 15 mg by mouth daily.   Yes Historical Provider, MD  omeprazole (PRILOSEC) 20 MG capsule Take 20 mg by mouth daily.   Yes Historical Provider, MD  QUEtiapine Fumarate (SEROQUEL PO) Take by mouth.   Yes Historical Provider, MD  clindamycin (CLEOCIN) 150 MG capsule Take 3 capsules (450 mg total) by mouth 3 (three) times daily. 12/14/15   Renarda Mullinix, PA-C   BP 169/108 mmHg  Pulse 89  Temp(Src) 99 F (37.2 C) (Oral)  Resp 20  Ht 5' (1.524 m)  Wt 80.74 kg  BMI 34.76 kg/m2  SpO2 96%  Physical Exam  Constitutional: She appears well-developed and well-nourished. No distress.  HENT:  Head: Normocephalic and atraumatic.  Mouth/Throat: Uvula is midline. Mucous membranes are not dry. No trismus in the jaw. Dental caries present. No dental abscesses or uvula swelling. No posterior oropharyngeal edema or posterior oropharyngeal erythema.  Swelling noted to the left cheek along the jawline and including the left lower lip. No overlying erythema, vesicles, pustules, bullae or desquamation. No induration or fluctuance of the cheek. Decay throughout. No erythema or dental abscess of the left gumline. No induration, erythema or tenderness of  the sublingual area. No trismus.  Eyes: Conjunctivae are normal. Right eye exhibits no discharge. Left eye exhibits no discharge. No scleral icterus.  Neck: Normal range of motion.  No soft tissue swelling of the neck.  Cardiovascular: Normal rate and regular rhythm.   Pulmonary/Chest: Effort normal. No stridor. No respiratory distress.  Musculoskeletal: Normal range of motion.  Neurological: She is alert. Coordination normal.  Skin: Skin is warm and dry.  Psychiatric: She has a normal mood and affect. Her behavior is normal.  Nursing note and vitals reviewed.   ED Course  Procedures (including critical care time) Labs Review Labs Reviewed  CBC WITH DIFFERENTIAL/PLATELET - Abnormal; Notable for the following:    WBC 12.8 (*)    Neutro Abs 11.5 (*)    All other components within normal limits  BASIC METABOLIC PANEL - Abnormal; Notable for the following:    Glucose, Bld 127 (*)    BUN 22 (*)    Calcium 8.8 (*)    All other components within normal limits  HCG, QUANTITATIVE, PREGNANCY    Imaging Review Ct Maxillofacial W/cm  12/14/2015  CLINICAL DATA:  Initial encounter for left facial swelling with dental pain. EXAM: CT MAXILLOFACIAL WITH CONTRAST TECHNIQUE: Multidetector CT imaging of the maxillofacial structures was performed with intravenous contrast. Multiplanar CT image reconstructions were also generated. A small metallic BB was placed on the right temple in order to reliably differentiate right from left. CONTRAST:  75mL OMNIPAQUE IOHEXOL 300 MG/ML  SOLN COMPARISON:  None. FINDINGS: Soft tissue swelling is seen in the region of the left mandible and cheek with edema/inflammatory changes seen to be centered in the region of the left mandibular ramus, tracking port the midline. No underlying rim enhancing or focal have cyst is evident. Multiple cavities are seen in the remaining teeth, but there is no definite periapical abscess on the current study. No evidence for tonsillar  abscess. Visualized paranasal sinuses are clear. Intra orbital fat is well preserved bilaterally. IMPRESSION: Edema/ inflammation in the soft tissues of the lower left face consistent with cellulitis, mainly centered in the region of the left mandibular ramus. No underlying soft tissue abscess is evident. No clear cut periapical abscess identified to pinpoint the source of the infection. Electronically Signed   By: Kennith CenterEric  Mansell M.D.   On: 12/14/2015 16:20   I have personally reviewed and evaluated these images and lab results as part of my medical decision-making.   EKG Interpretation None      MDM   Final diagnoses:  Cellulitis of face   Patient presenting with tooth pain and facial swelling. No obvious abscess on exam. Left cheek is swollen over jawline and bottom lip. No overlying erythema. Swelling does not involve oropharynx or neck. Pain controlled in ED with toradol. CT maxillofacial suggestive of cellulitis without abscess. Will discharge with clindamycin and encouraged pt to follow-up with dentist. Also instructed pt to follow up  with PCP or UC in 2 days to ensure facial swelling is improving. Resource guide given in discharge paperwork. Return precautions discussed with pt and given in discharge paperwork. Stable for discharge.      Kelly Gala Gilberte Gorley, PA-C 12/14/15 1705  Pricilla Loveless, MD 12/16/15 [phone]

## 2016-02-25 ENCOUNTER — Other Ambulatory Visit: Payer: Self-pay

## 2016-02-25 ENCOUNTER — Other Ambulatory Visit: Payer: Self-pay | Admitting: Obstetrics and Gynecology

## 2016-02-25 DIAGNOSIS — Z1231 Encounter for screening mammogram for malignant neoplasm of breast: Secondary | ICD-10-CM

## 2016-02-26 ENCOUNTER — Inpatient Hospital Stay (HOSPITAL_COMMUNITY): Admission: RE | Admit: 2016-02-26 | Payer: Self-pay | Source: Ambulatory Visit

## 2016-03-02 ENCOUNTER — Other Ambulatory Visit: Payer: No Typology Code available for payment source

## 2016-03-02 DIAGNOSIS — B182 Chronic viral hepatitis C: Secondary | ICD-10-CM

## 2016-03-02 LAB — COMPREHENSIVE METABOLIC PANEL
ALBUMIN: 4.2 g/dL (ref 3.6–5.1)
ALK PHOS: 82 U/L (ref 33–130)
ALT: 30 U/L — AB (ref 6–29)
AST: 23 U/L (ref 10–35)
BILIRUBIN TOTAL: 0.4 mg/dL (ref 0.2–1.2)
BUN: 23 mg/dL (ref 7–25)
CO2: 26 mmol/L (ref 20–31)
CREATININE: 1.22 mg/dL — AB (ref 0.50–1.05)
Calcium: 9.3 mg/dL (ref 8.6–10.4)
Chloride: 105 mmol/L (ref 98–110)
Glucose, Bld: 88 mg/dL (ref 65–99)
Potassium: 4.2 mmol/L (ref 3.5–5.3)
Sodium: 139 mmol/L (ref 135–146)
Total Protein: 7.1 g/dL (ref 6.1–8.1)

## 2016-03-02 LAB — HIV ANTIBODY (ROUTINE TESTING W REFLEX): HIV 1&2 Ab, 4th Generation: NONREACTIVE

## 2016-03-02 LAB — IRON: Iron: 88 ug/dL (ref 45–160)

## 2016-03-03 LAB — HEPATITIS B SURFACE ANTIBODY,QUALITATIVE: Hep B S Ab: NEGATIVE

## 2016-03-03 LAB — HEPATITIS B SURFACE ANTIGEN: HEP B S AG: NEGATIVE

## 2016-03-03 LAB — HEPATITIS B CORE ANTIBODY, TOTAL: Hep B Core Total Ab: NONREACTIVE

## 2016-03-03 LAB — PROTIME-INR
INR: 0.99 (ref <1.50)
Prothrombin Time: 13.2 seconds (ref 11.6–15.2)

## 2016-03-03 LAB — ANA: Anti Nuclear Antibody(ANA): NEGATIVE

## 2016-03-03 LAB — HEPATITIS A ANTIBODY, TOTAL: HEP A TOTAL AB: NONREACTIVE

## 2016-03-06 LAB — HCV RNA,LIPA RFLX NS5A DRUG RESIST

## 2016-03-06 LAB — HCV RNA, QUANT REAL-TIME PCR W/REFLEX
HCV RNA, PCR, QN (LOG): 6.8 {Log_IU}/mL — AB
HCV RNA, PCR, QN: 6280000 IU/mL — ABNORMAL HIGH

## 2016-03-08 LAB — HCV RNA NS5A DRUG RESISTANCE

## 2016-03-31 ENCOUNTER — Ambulatory Visit (INDEPENDENT_AMBULATORY_CARE_PROVIDER_SITE_OTHER): Payer: No Typology Code available for payment source | Admitting: Internal Medicine

## 2016-03-31 ENCOUNTER — Encounter: Payer: Self-pay | Admitting: Internal Medicine

## 2016-03-31 VITALS — BP 124/82 | HR 94 | Temp 97.8°F | Ht 60.0 in | Wt 185.0 lb

## 2016-03-31 DIAGNOSIS — Z23 Encounter for immunization: Secondary | ICD-10-CM

## 2016-03-31 DIAGNOSIS — B182 Chronic viral hepatitis C: Secondary | ICD-10-CM

## 2016-03-31 DIAGNOSIS — I10 Essential (primary) hypertension: Secondary | ICD-10-CM

## 2016-03-31 DIAGNOSIS — F411 Generalized anxiety disorder: Secondary | ICD-10-CM

## 2016-03-31 DIAGNOSIS — K219 Gastro-esophageal reflux disease without esophagitis: Secondary | ICD-10-CM

## 2016-03-31 DIAGNOSIS — F314 Bipolar disorder, current episode depressed, severe, without psychotic features: Secondary | ICD-10-CM | POA: Insufficient documentation

## 2016-03-31 DIAGNOSIS — G4700 Insomnia, unspecified: Secondary | ICD-10-CM

## 2016-03-31 DIAGNOSIS — F1910 Other psychoactive substance abuse, uncomplicated: Secondary | ICD-10-CM

## 2016-03-31 HISTORY — DX: Essential (primary) hypertension: I10

## 2016-03-31 MED ORDER — ELBASVIR-GRAZOPREVIR 50-100 MG PO TABS: 1.0000 | ORAL_TABLET | Freq: Every day | ORAL | Status: DC

## 2016-03-31 NOTE — Progress Notes (Signed)
[facility]   CC: consideration for treatment for chronic hepatitis C  HPI:  +Kelly Kelley is a 50 y.o. female who presents for initial evaluation and management of chronic hepatitis C.  Patient tested positive last year. Hepatitis C-associated risk factors present are: IV drug abuse (details: 9 months without). Patient denies renal dialysis, sexual contact with person with liver disease, tattoos. Patient has had other studies performed. Results: hepatitis C RNA by PCR, result: positive. Patient has not had prior treatment for Hepatitis C. Patient does not have a past history of liver disease. Patient does not have a family history of liver disease. Patient does not  have associated signs or symptoms related to liver disease.  Labs reviewed and confirm chronic hepatitis C with a positive viral load.   Records reviewed from PCP from [facility] and is doing well in the program.       Patient does not have documented immunity to Hepatitis A. Patient does not have documented immunity to Hepatitis B.    Review of Systems:  Constitutional: negative for sweats and fatigue Respiratory: negative for cough Gastrointestinal: negative for nausea and diarrhea All other systems reviewed and are negative      Past Medical History  Diagnosis Date  . PTSD (post-traumatic stress disorder)   . Insomnia   . Borderline personality disorder   . Substance abuse   . Bipolar disorder (HCC)   . Anxiety   . Asthma   . Essential hypertension 03/31/2016    Prior to Admission medications   Medication Sig Start Date End Date Taking? Authorizing Provider  amitriptyline (ELAVIL) 25 MG tablet Take 25 mg by mouth at bedtime.   Yes Historical Provider, MD  citalopram (CELEXA) 10 MG tablet Take 10 mg by mouth daily.   Yes Historical Provider, MD  doxepin (SINEQUAN) 100 MG capsule Take 100 mg by mouth at bedtime.   Yes Historical Provider, MD  furosemide (LASIX) 20 MG tablet Take 20 mg by  mouth.   Yes Historical Provider, MD  hydrOXYzine (ATARAX/VISTARIL) 50 MG tablet Take 50 mg by mouth 3 (three) times daily as needed.   Yes Historical Provider, MD  lamoTRIgine (LAMICTAL) 25 MG tablet Take 25 mg by mouth daily.   Yes Historical Provider, MD  lisinopril (PRINIVIL,ZESTRIL) 10 MG tablet Take 10 mg by mouth daily.   Yes Historical Provider, MD  omeprazole (PRILOSEC) 20 MG capsule Take 20 mg by mouth daily.   Yes Historical Provider, MD  QUEtiapine Fumarate (SEROQUEL PO) Take by mouth.   Yes Historical Provider, MD  Elbasvir-Grazoprevir (ZEPATIER) 50-100 MG TABS Take 1 tablet by mouth daily. 03/31/16   Gardiner Barefoot, MD    Allergies  Allergen Reactions  . Morphine And Related   . Penicillins     Social History  Substance Use Topics  . Smoking status: Current Every Day Smoker -- 1.00 packs/day  . Smokeless tobacco: Never Used  . Alcohol Use: 0.0 oz/week    0 Standard drinks or equivalent per week     Comment: in recovery    FMHx: no liver disease   Objective:  Constitutional: in no apparent distress and alert,  Filed Vitals:   03/31/16 0912  BP: 124/82  Pulse: 94  Temp: 97.8 F (36.6 C)   Eyes: anicteric Cardiovascular: Cor RRR and No murmurs Respiratory: CTA B; normal respiratory effort Gastrointestinal: Bowel sounds are normal, liver is not enlarged, spleen is not enlarged Musculoskeletal: no pedal edema noted Skin:  negatives: no rash; no porphyria cutanea tarda Lymphatic: no cervical lymphadenopathy   Laboratory Genotype: No results found for: HCVGENOTYPE HCV viral load: No results found for: HCVQUANT Lab Results  Component Value Date   WBC 12.8* 12/14/2015   HGB 13.5 12/14/2015   HCT 39.8 12/14/2015   MCV 86.3 12/14/2015   PLT 168 12/14/2015    Lab Results  Component Value Date   CREATININE 1.22* 03/02/2016   BUN 23 03/02/2016   NA 139 03/02/2016   K 4.2 03/02/2016   CL 105 03/02/2016   CO2 26 03/02/2016    Lab Results  Component Value  Date   ALT 30* 03/02/2016   AST 23 03/02/2016   ALKPHOS 82 03/02/2016     Labs and history reviewed and show CHILD-PUGH A  5-6 points: Child class A 7-9 points: Child class B 10-15 points: Child class C  Lab Results  Component Value Date   INR 0.99 03/02/2016   BILITOT 0.4 03/02/2016   ALBUMIN 4.2 03/02/2016     Assessment: New Patient with Chronic Hepatitis C genotype 1a, untreated.  I discussed with the patient the lab findings that confirm chronic hepatitis C as well as the natural history and progression of disease including about 30% of people who develop cirrhosis of the liver if left untreated and once cirrhosis is established there is a 2-7% risk per year of liver cancer and liver failure.  I discussed the importance of treatment and benefits in reducing the risk, even if significant liver fibrosis exists.   Plan: 1) Patient counseled extensively on limiting acetaminophen to no more than 2 grams daily, avoidance of alcohol. 2) Transmission discussed with patient including sexual transmission, sharing razors and toothbrush.   3) Will need referral to gastroenterology if concern for cirrhosis 4) Will need referral for substance abuse counseling: No.; Further work up to include urine drug screen  No. 5) Will prescribe Zepatier for 12 weeks via [facility] 6) Hepatitis A vaccine Yes.   7) Hepatitis B vaccine Yes.   8) Pneumovax vaccine if concern for cirrhosis 9) Further work up to include liver staging with elastography 10) NS5A test  Yes.   is negative for mutations 10) will follow up after starting medication

## 2016-03-31 NOTE — Patient Instructions (Signed)
Date 03/31/2016  Dear Ms Roxan Hockeyobinson, As discussed in the ID Clinic, your hepatitis C therapy will include the following medications:          Zepatier (elbasvir 50 mg/grazoprevir 100 mg) for 12 weeks               Please note that ALL MEDICATIONS WILL START ON THE SAME DATE for a total of 12 weeks. ---------------------------------------------------------------- Your HCV Treatment Start Date: TBA   Your HCV genotype:  1a    Liver Fibrosis: TBD    ---------------------------------------------------------------- YOUR PHARMACY CONTACT:   [facility] [address] Phone: [phone] Hours: Monday to Friday 7:30 am to 6:00 pm   Please always contact your pharmacy at least 3-4 business days before you run out of medications to ensure your next month's medication is ready or 1 week prior to running out if you receive it by mail.  Remember, each prescription is for 28 days. ---------------------------------------------------------------- GENERAL NOTES REGARDING YOUR HEPATITIS C MEDICATION:  ZEPATIER is available as a beige-colored, oval-shaped, film-coated tablet debossed with "770" on one side and plain on the other. Each tablet contains 50 mg elbasvir and 100 mg grazoprevir.  Common side effects of ZEPATIER when used without ribavirin include: - feeling tired -trouble sleeping - headache -diarrhea - nausea  Please note that this only lists the most common side effects and is NOT a comprehensive list of the potential side effects of these medications. For more information, please review the drug information sheets that come with your medication package from the pharmacy.  ---------------------------------------------------------------- GENERAL HELPFUL HINTS ON HCV THERAPY: 1. Stay well-hydrated. 2. Notify the ID Clinic of any changes in your other over-the-counter/herbal or prescription medications. 3. If you miss a  dose of your medication, take the missed dose as soon as you remember. Return to your regular time/dose schedule the next day.  4.  Do not stop taking your medications without first talking with your healthcare provider. 5.  You may take Tylenol (acetaminophen), as long as the dose is less than 2000 mg (OR no more than 4 tablets of the Tylenol Extra Strengths 500mg  tablet) in 24 hours. 6.  You will see our pharmacist-specialist within the first 2 weeks of starting your medication. 7.  You will need to obtain routine labs around week 4 and12 weeks after starting and then 3 to 6 months after finishing Zepatier.   8.  If ribavirin is part of your regimen, you also will have a lab visit within 2 weeks.   Staci RighterOMER, Airyonna Franklyn, MD  [facility] [address] [place], [place]  [id number] [phone]

## 2016-03-31 NOTE — Addendum Note (Signed)
Addended by: Wendall MolaOCKERHAM, JACQUELINE A on: 03/31/2016 02:06 PM   Modules accepted: Orders

## 2016-04-01 ENCOUNTER — Telehealth: Payer: Self-pay | Admitting: *Deleted

## 2016-04-01 NOTE — Telephone Encounter (Signed)
Called patient and notified her of ultrasound appt for 05/04/16 at 7[facility]45 [facility]. Nothing to eat or drink after midnight. Kelly MolaJacqueline Minha Fulco

## 2016-04-05 ENCOUNTER — Encounter: Payer: Self-pay | Admitting: Pharmacy Technician

## 2016-04-21 ENCOUNTER — Ambulatory Visit: Payer: No Typology Code available for payment source

## 2016-05-04 ENCOUNTER — Ambulatory Visit (HOSPITAL_COMMUNITY): Payer: No Typology Code available for payment source

## 2016-05-18 ENCOUNTER — Telehealth: Payer: Self-pay | Admitting: Lab

## 2016-05-18 NOTE — Telephone Encounter (Signed)
Pt called me to explain that she had been incarcerated for about a month and a half.  Because of this, she has not been on her Hep  C Meds.  She wants to start back on her meds again now that she has been released.  I told pt I would speak with the pharmacist and he or I would get back with her regarding her concerns.

## 2016-05-19 ENCOUNTER — Other Ambulatory Visit: Payer: Self-pay | Admitting: Pharmacist Clinician (PhC)/ Clinical Pharmacy Specialist

## 2016-05-19 ENCOUNTER — Telehealth: Payer: Self-pay | Admitting: Pharmacist Clinician (PhC)/ Clinical Pharmacy Specialist

## 2016-05-19 DIAGNOSIS — B182 Chronic viral hepatitis C: Secondary | ICD-10-CM

## 2016-05-19 NOTE — Telephone Encounter (Signed)
Oops thanks

## 2016-05-19 NOTE — Telephone Encounter (Signed)
Apparently, started on Zepatier x 2 wks then got tossed in jail. Off of therapy since. Going to bring her back next week for resistant testing. Told her to stop med completely for now.

## 2016-05-25 ENCOUNTER — Other Ambulatory Visit: Payer: No Typology Code available for payment source

## 2016-05-25 DIAGNOSIS — B182 Chronic viral hepatitis C: Secondary | ICD-10-CM

## 2016-05-27 ENCOUNTER — Emergency Department (HOSPITAL_COMMUNITY)
Admission: EM | Admit: 2016-05-27 | Discharge: 2016-05-27 | Disposition: A | Discharge status: Home / Self Care | Payer: No Typology Code available for payment source | Attending: Emergency Medicine | Admitting: Emergency Medicine

## 2016-05-27 ENCOUNTER — Encounter (HOSPITAL_COMMUNITY): Payer: Self-pay | Admitting: Emergency Medicine

## 2016-05-27 ENCOUNTER — Emergency Department (HOSPITAL_COMMUNITY): Payer: No Typology Code available for payment source

## 2016-05-27 DIAGNOSIS — Y939 Activity, unspecified: Secondary | ICD-10-CM | POA: Insufficient documentation

## 2016-05-27 DIAGNOSIS — Z79899 Other long term (current) drug therapy: Secondary | ICD-10-CM | POA: Insufficient documentation

## 2016-05-27 DIAGNOSIS — I10 Essential (primary) hypertension: Secondary | ICD-10-CM | POA: Insufficient documentation

## 2016-05-27 DIAGNOSIS — Y92410 Unspecified street and highway as the place of occurrence of the external cause: Secondary | ICD-10-CM | POA: Insufficient documentation

## 2016-05-27 DIAGNOSIS — S76011A Strain of muscle, fascia and tendon of right hip, initial encounter: Secondary | ICD-10-CM

## 2016-05-27 DIAGNOSIS — J45909 Unspecified asthma, uncomplicated: Secondary | ICD-10-CM | POA: Insufficient documentation

## 2016-05-27 DIAGNOSIS — F17200 Nicotine dependence, unspecified, uncomplicated: Secondary | ICD-10-CM | POA: Insufficient documentation

## 2016-05-27 DIAGNOSIS — F319 Bipolar disorder, unspecified: Secondary | ICD-10-CM | POA: Insufficient documentation

## 2016-05-27 DIAGNOSIS — Y999 Unspecified external cause status: Secondary | ICD-10-CM | POA: Insufficient documentation

## 2016-05-27 DIAGNOSIS — M25551 Pain in right hip: Secondary | ICD-10-CM

## 2016-05-27 MED ORDER — KETOROLAC TROMETHAMINE 30 MG/ML IJ SOLN
30.0000 mg | Freq: Once | INTRAMUSCULAR | Status: AC
  Administered 2016-05-27: 30 mg via INTRAMUSCULAR
  Filled 2016-05-27: qty 1

## 2016-05-27 MED ORDER — MELOXICAM 15 MG PO TABS: 15.0000 mg | ORAL_TABLET | Freq: Every day | ORAL | Status: DC
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
## 2016-05-27 NOTE — ED Notes (Signed)
Pt refused discharge vitals 
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
## 2016-05-27 NOTE — Discharge Instructions (Signed)
Take mobic as directed for inflammation and pain with tylenol for breakthrough pain. Alternate between ice and heat to your hip to help with pain, no more than 20 minutes at a time every hour for each. Use crutches as needed for comfort with walking. Call orthopedic follow up today or tomorrow to schedule followup appointment for recheck of ongoing hip pain in 1-2 weeks for ongoing evaluation of your hip pain. Return to the ER for changes or worsening symptoms.      Hip Pain Your hip is the joint between your upper legs and your lower pelvis. The bones, cartilage, tendons, and muscles of your hip joint perform a lot of work each day supporting your body weight and allowing you to move around. Hip pain can range from a minor ache to severe pain in one or both of your hips. Pain may be felt on the inside of the hip joint near the groin, or the outside near the buttocks and upper thigh. You may have swelling or stiffness as well.  HOME CARE INSTRUCTIONS   Take medicines only as directed by your health care provider.  Apply ice to the injured area:  Put ice in a plastic bag.  Place a towel between your skin and the bag.  Leave the ice on for 15-20 minutes at a time, 3-4 times a day.  Keep your leg raised (elevated) when possible to lessen swelling.  Avoid activities that cause pain.  Follow specific exercises as directed by your health care provider.  Sleep with a pillow between your legs on your most comfortable side.  Record how often you have hip pain, the location of the pain, and what it feels like. SEEK MEDICAL CARE IF:   You are unable to put weight on your leg.  Your hip is red or swollen or very tender to touch.  Your pain or swelling continues or worsens after 1 week.  You have increasing difficulty walking.  You have a fever. SEEK IMMEDIATE MEDICAL CARE IF:   You have fallen.  You have a sudden increase in pain and swelling in your hip. MAKE SURE YOU:   Understand  these instructions.  Will watch your condition.  Will get help right away if you are not doing well or get worse.   This information is not intended to replace advice given to you by your health care provider. Make sure you discuss any questions you have with your health care provider.   Document Released: 05/19/2010 Document Revised: 12/20/2014 Document Reviewed: 07/26/2013 Elsevier Interactive Patient Education 2016 Elsevier Inc.  [employer] Therapy Heat therapy can help ease sore, stiff, injured, and tight muscles and joints. Heat relaxes your muscles, which may help ease your pain. Heat therapy should only be used on old, pre-existing, or long-lasting (chronic) injuries. Do not use heat therapy unless told by your doctor. HOW TO USE HEAT THERAPY There are several different kinds of heat therapy, including:  Moist heat pack.  Warm water bath.  Hot water bottle.  Electric heating pad.  Heated gel pack.  Heated wrap.  Electric heating pad. GENERAL HEAT THERAPY RECOMMENDATIONS   Do not sleep while using heat therapy. Only use heat therapy while you are awake.  Your skin may turn pink while using heat therapy. Do not use heat therapy if your skin turns red.  Do not use heat therapy if you have new pain.  High heat or long exposure to heat can cause burns. Be careful when using heat therapy  to avoid burning your skin.  Do not use heat therapy on areas of your skin that are already irritated, such as with a rash or sunburn. GET HELP IF:   You have blisters, redness, swelling (puffiness), or numbness.  You have new pain.  Your pain is worse. MAKE SURE YOU:  Understand these instructions.  Will watch your condition.  Will get help right away if you are not doing well or get worse.   This information is not intended to replace advice given to you by your health care provider. Make sure you discuss any questions you have with your health care provider.   Document  Released: 02/21/2012 Document Revised: 12/20/2014 Document Reviewed: 01/22/2014 Elsevier Interactive Patient Education 2016 Elsevier Inc.  Cryotherapy Cryotherapy is when you put ice on your injury. Ice helps lessen pain and puffiness (swelling) after an injury. Ice works the best when you start using it in the first 24 to 48 hours after an injury. HOME CARE  Put a dry or damp towel between the ice pack and your skin.  You may press gently on the ice pack.  Leave the ice on for no more than 10 to 20 minutes at a time.  Check your skin after 5 minutes to make sure your skin is okay.  Rest at least 20 minutes between ice pack uses.  Stop using ice when your skin loses feeling (numbness).  Do not use ice on someone who cannot tell you when it hurts. This includes small children and people with memory problems (dementia). GET HELP RIGHT AWAY IF:  You have white spots on your skin.  Your skin turns blue or pale.  Your skin feels waxy or hard.  Your puffiness gets worse. MAKE SURE YOU:   Understand these instructions.  Will watch your condition.  Will get help right away if you are not doing well or get worse.   This information is not intended to replace advice given to you by your health care provider. Make sure you discuss any questions you have with your health care provider.   Document Released: 05/17/2008 Document Revised: 02/21/2012 Document Reviewed: 07/22/2011 Elsevier Interactive Patient Education 2016 Elsevier Inc.  Musculoskeletal Pain Musculoskeletal pain is muscle and boney aches and pains. These pains can occur in any part of the body. Your caregiver may treat you without knowing the cause of the pain. They may treat you if blood or urine tests, X-rays, and other tests were normal.  CAUSES There is often not a definite cause or reason for these pains. These pains may be caused by a type of germ (virus). The discomfort may also come from overuse. Overuse  includes working out too hard when your body is not fit. Boney aches also come from weather changes. Bone is sensitive to atmospheric pressure changes. HOME CARE INSTRUCTIONS   Ask when your test results will be ready. Make sure you get your test results.  Only take over-the-counter or prescription medicines for pain, discomfort, or fever as directed by your caregiver. If you were given medications for your condition, do not drive, operate machinery or power tools, or sign legal documents for 24 hours. Do not drink alcohol. Do not take sleeping pills or other medications that may interfere with treatment.  Continue all activities unless the activities cause more pain. When the pain lessens, slowly resume normal activities. Gradually increase the intensity and duration of the activities or exercise.  During periods of severe pain, bed rest may be helpful.  Lay or sit in any position that is comfortable.  Putting ice on the injured area.  Put ice in a bag.  Place a towel between your skin and the bag.  Leave the ice on for 15 to 20 minutes, 3 to 4 times a day.  Follow up with your caregiver for continued problems and no reason can be found for the pain. If the pain becomes worse or does not go away, it may be necessary to repeat tests or do additional testing. Your caregiver may need to look further for a possible cause. SEEK IMMEDIATE MEDICAL CARE IF:  You have pain that is getting worse and is not relieved by medications.  You develop chest pain that is associated with shortness or breath, sweating, feeling sick to your stomach (nauseous), or throw up (vomit).  Your pain becomes localized to the abdomen.  You develop any new symptoms that seem different or that concern you. MAKE SURE YOU:   Understand these instructions.  Will watch your condition.  Will get help right away if you are not doing well or get worse.   This information is not intended to replace advice given to you by  your health care provider. Make sure you discuss any questions you have with your health care provider.   Document Released: 11/29/2005 Document Revised: 02/21/2012 Document Reviewed: 08/03/2013 Elsevier Interactive Patient Education [employer]
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
## 2016-05-31 LAB — HCV RNA NS5A DRUG RESISTANCE: HCV NS5A Subtype: NOT DETECTED

## 2016-06-02 LAB — HEPATITIS C VIRAL RNA NS3 GENOTYPE: HCV NS3 SUBTYPE: NOT DETECTED

## 2016-07-13 ENCOUNTER — Ambulatory Visit: Payer: No Typology Code available for payment source | Admitting: Internal Medicine

## 2016-08-19 ENCOUNTER — Telehealth: Payer: Self-pay | Admitting: Lab

## 2016-08-19 NOTE — Telephone Encounter (Signed)
I returned patient's call regarding wanting to reschedule appmt for follow up with Dr. Luciana Axeomer on 07/13/2016

## 2016-09-01 ENCOUNTER — Other Ambulatory Visit: Payer: Self-pay | Admitting: Obstetrics and Gynecology

## 2016-09-01 DIAGNOSIS — Z1231 Encounter for screening mammogram for malignant neoplasm of breast: Secondary | ICD-10-CM

## 2016-09-09 ENCOUNTER — Ambulatory Visit (HOSPITAL_COMMUNITY): Payer: No Typology Code available for payment source | Attending: Obstetrics and Gynecology

## 2016-11-24 ENCOUNTER — Telehealth: Payer: Self-pay | Admitting: *Deleted

## 2016-11-24 NOTE — Telephone Encounter (Signed)
ROI previously received but processed by [facility] on 11/16/16.  [facility] has not received records.  Requesting last office note, medications and labs be faxed ASAP.  Records faxed to [phone]
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
## 2017-07-17 ENCOUNTER — Emergency Department (HOSPITAL_COMMUNITY): Payer: Self-pay

## 2017-07-17 ENCOUNTER — Encounter (HOSPITAL_COMMUNITY): Payer: Self-pay | Admitting: Emergency Medicine

## 2017-07-17 ENCOUNTER — Inpatient Hospital Stay (HOSPITAL_COMMUNITY)
Admission: EM | Admit: 2017-07-17 | Discharge: 2017-07-22 | DRG: 917 | Disposition: A | Discharge status: Home / Self Care | Payer: Self-pay | Attending: Family Medicine | Admitting: Family Medicine

## 2017-07-17 DIAGNOSIS — F1910 Other psychoactive substance abuse, uncomplicated: Secondary | ICD-10-CM | POA: Diagnosis present

## 2017-07-17 DIAGNOSIS — E876 Hypokalemia: Secondary | ICD-10-CM | POA: Diagnosis present

## 2017-07-17 DIAGNOSIS — J9811 Atelectasis: Secondary | ICD-10-CM | POA: Diagnosis present

## 2017-07-17 DIAGNOSIS — R0902 Hypoxemia: Secondary | ICD-10-CM | POA: Diagnosis present

## 2017-07-17 DIAGNOSIS — Z8249 Family history of ischemic heart disease and other diseases of the circulatory system: Secondary | ICD-10-CM

## 2017-07-17 DIAGNOSIS — Z79899 Other long term (current) drug therapy: Secondary | ICD-10-CM

## 2017-07-17 DIAGNOSIS — R69 Illness, unspecified: Secondary | ICD-10-CM

## 2017-07-17 DIAGNOSIS — G92 Toxic encephalopathy: Secondary | ICD-10-CM | POA: Diagnosis present

## 2017-07-17 DIAGNOSIS — B9620 Unspecified Escherichia coli [E. coli] as the cause of diseases classified elsewhere: Secondary | ICD-10-CM | POA: Diagnosis present

## 2017-07-17 DIAGNOSIS — T43621A Poisoning by amphetamines, accidental (unintentional), initial encounter: Principal | ICD-10-CM | POA: Diagnosis present

## 2017-07-17 DIAGNOSIS — F314 Bipolar disorder, current episode depressed, severe, without psychotic features: Secondary | ICD-10-CM | POA: Diagnosis present

## 2017-07-17 DIAGNOSIS — R410 Disorientation, unspecified: Secondary | ICD-10-CM

## 2017-07-17 DIAGNOSIS — I10 Essential (primary) hypertension: Secondary | ICD-10-CM | POA: Diagnosis present

## 2017-07-17 DIAGNOSIS — F411 Generalized anxiety disorder: Secondary | ICD-10-CM | POA: Diagnosis present

## 2017-07-17 DIAGNOSIS — Z9851 Tubal ligation status: Secondary | ICD-10-CM

## 2017-07-17 DIAGNOSIS — Z880 Allergy status to penicillin: Secondary | ICD-10-CM

## 2017-07-17 DIAGNOSIS — Z885 Allergy status to narcotic agent status: Secondary | ICD-10-CM

## 2017-07-17 DIAGNOSIS — E162 Hypoglycemia, unspecified: Secondary | ICD-10-CM | POA: Diagnosis present

## 2017-07-17 DIAGNOSIS — F17210 Nicotine dependence, cigarettes, uncomplicated: Secondary | ICD-10-CM | POA: Diagnosis present

## 2017-07-17 DIAGNOSIS — B182 Chronic viral hepatitis C: Secondary | ICD-10-CM | POA: Diagnosis present

## 2017-07-17 DIAGNOSIS — J45909 Unspecified asthma, uncomplicated: Secondary | ICD-10-CM | POA: Diagnosis present

## 2017-07-17 DIAGNOSIS — R0689 Other abnormalities of breathing: Secondary | ICD-10-CM | POA: Diagnosis present

## 2017-07-17 DIAGNOSIS — G9340 Encephalopathy, unspecified: Secondary | ICD-10-CM | POA: Diagnosis present

## 2017-07-17 DIAGNOSIS — N390 Urinary tract infection, site not specified: Secondary | ICD-10-CM | POA: Diagnosis present

## 2017-07-17 DIAGNOSIS — T405X1A Poisoning by cocaine, accidental (unintentional), initial encounter: Secondary | ICD-10-CM | POA: Diagnosis present

## 2017-07-17 LAB — CBC WITH DIFFERENTIAL/PLATELET
Basophils Absolute: 0 10*3/uL (ref 0.0–0.1)
Basophils Relative: 0 %
Eosinophils Absolute: 0 10*3/uL (ref 0.0–0.7)
Eosinophils Relative: 0 %
HEMATOCRIT: 40.2 % (ref 36.0–46.0)
HEMOGLOBIN: 14 g/dL (ref 12.0–15.0)
LYMPHS ABS: 0.9 10*3/uL (ref 0.7–4.0)
LYMPHS PCT: 14 %
MCH: 31.1 pg (ref 26.0–34.0)
MCHC: 34.8 g/dL (ref 30.0–36.0)
MCV: 89.3 fL (ref 78.0–100.0)
MONOS PCT: 4 %
Monocytes Absolute: 0.3 10*3/uL (ref 0.1–1.0)
NEUTROS ABS: 5.5 10*3/uL (ref 1.7–7.7)
NEUTROS PCT: 82 %
Platelets: 265 10*3/uL (ref 150–400)
RBC: 4.5 MIL/uL (ref 3.87–5.11)
RDW: 12.8 % (ref 11.5–15.5)
WBC: 6.7 10*3/uL (ref 4.0–10.5)

## 2017-07-17 LAB — ETHANOL

## 2017-07-17 LAB — I-STAT BETA HCG BLOOD, ED (MC, WL, AP ONLY): I-stat hCG, quantitative: 7.3 m[IU]/mL — ABNORMAL HIGH (ref <5)

## 2017-07-17 LAB — BLOOD GAS, ARTERIAL
ACID-BASE EXCESS: 1.2 mmol/L (ref 0.0–2.0)
BICARBONATE: 25.2 mmol/L (ref 20.0–28.0)
DRAWN BY: 11249
FIO2: 21
O2 SAT: 92.2 %
Patient temperature: 97.5
pCO2 arterial: 38.8 mmHg (ref 32.0–48.0)
pH, Arterial: 7.425 (ref 7.350–7.450)
pO2, Arterial: 60.9 mmHg — ABNORMAL LOW (ref 83.0–108.0)

## 2017-07-17 LAB — COMPREHENSIVE METABOLIC PANEL
ALBUMIN: 4.2 g/dL (ref 3.5–5.0)
ALK PHOS: 62 U/L (ref 38–126)
ALT: 10 U/L — AB (ref 14–54)
ANION GAP: 12 (ref 5–15)
AST: 21 U/L (ref 15–41)
BUN: 15 mg/dL (ref 6–20)
CALCIUM: 9.3 mg/dL (ref 8.9–10.3)
CO2: 24 mmol/L (ref 22–32)
Chloride: 107 mmol/L (ref 101–111)
Creatinine, Ser: 0.8 mg/dL (ref 0.44–1.00)
GFR calc Af Amer: >60 mL/min (ref >60)
GFR calc non Af Amer: >60 mL/min (ref >60)
GLUCOSE: 118 mg/dL — AB (ref 65–99)
Potassium: 3.5 mmol/L (ref 3.5–5.1)
SODIUM: 143 mmol/L (ref 135–145)
Total Bilirubin: 1 mg/dL (ref 0.3–1.2)
Total Protein: 7.3 g/dL (ref 6.5–8.1)

## 2017-07-17 LAB — SALICYLATE LEVEL: Salicylate Lvl: <7 mg/dL (ref 2.8–30.0)

## 2017-07-17 LAB — PROTIME-INR
INR: 1.04
Prothrombin Time: 13.6 seconds (ref 11.4–15.2)

## 2017-07-17 LAB — I-STAT TROPONIN, ED: TROPONIN I, POC: 0 ng/mL (ref 0.00–0.08)

## 2017-07-17 LAB — ACETAMINOPHEN LEVEL: Acetaminophen (Tylenol), Serum: <10 ug/mL — ABNORMAL LOW (ref 10–30)

## 2017-07-17 LAB — I-STAT CG4 LACTIC ACID, ED: LACTIC ACID, VENOUS: 0.76 mmol/L (ref 0.5–1.9)

## 2017-07-17 LAB — LIPASE, BLOOD: Lipase: 21 U/L (ref 11–51)

## 2017-07-17 LAB — AMMONIA: AMMONIA: 24 umol/L (ref 9–35)

## 2017-07-17 MED ORDER — SODIUM CHLORIDE 0.9 % IV SOLN
Freq: Once | INTRAVENOUS | Status: AC
Start: 2017-07-17 — End: 2017-07-18
  Administered 2017-07-17: 23:00:00 via INTRAVENOUS
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
## 2017-07-17 NOTE — ED Provider Notes (Signed)
[facility]-EMERGENCY DEPT Provider Note   CSN: [id number] Arrival date & time: 07/17/17  2201     History   Chief Complaint No chief complaint on file.   HPI Kelly Kelley is a 51 y.o. female.  HPI Patient is from home with mental status change. History is from EMS. Patient reportedly lives with a companion. He saw her at 4 AM and reported that she was normal. At 9 AM he left for work and she was sleeping. When the roommate returned back home, she was in the same place as she had been when he left in the morning. She was difficult to arouse and confused. Per EMS, that bystander expressed some concern of patient possibly overdosing on amitriptyline because he found an empty bottle by her bed. EMS however observed that the empty bottle was dated August 2017 and she had more recent prescriptions in her purse that were full. Patient was protecting her airway, pupils were not pinpoint she was arousable to voice in transport. Vital signs in route were stable. CBG was 77. Patient was able to follow some commands but alternatively would become combative or somnolent. Past Medical History:  Diagnosis Date  . Anxiety   . Asthma   . Bipolar disorder (HCC)   . Borderline personality disorder   . Essential hypertension 03/31/2016  . Insomnia   . PTSD (post-traumatic stress disorder)   . Substance abuse     Patient Active Problem List   Diagnosis Date Noted  . Acute encephalopathy 07/18/2017  . Chronic hepatitis C without hepatic coma (HCC) 03/31/2016  . Generalized anxiety disorder 03/31/2016  . Substance abuse 03/31/2016  . Insomnia 03/31/2016  . GERD (gastroesophageal reflux disease) 03/31/2016  . Severe depressed bipolar I disorder (HCC) 03/31/2016  . Essential hypertension 03/31/2016    Past Surgical History:  Procedure Laterality Date  . CESAREAN SECTION    . TUBAL LIGATION      OB History    No data available       Home Medications    Prior to Admission medications     Medication Sig Start Date End Date Taking? Authorizing Provider  albuterol (PROVENTIL HFA;VENTOLIN HFA) 108 (90 Base) MCG/ACT inhaler Inhale 2 puffs into the lungs every 6 (six) hours as needed for wheezing or shortness of breath.    [provider]  amitriptyline (ELAVIL) 50 MG tablet Take 50 mg by mouth 2 (two) times daily.    [provider]  citalopram (CELEXA) 20 MG tablet Take 20 mg by mouth daily.    [provider]  doxepin (SINEQUAN) 25 MG capsule Take 25 mg by mouth at bedtime.    [provider]  Elbasvir-Grazoprevir (ZEPATIER) 50-100 MG TABS Take 1 tablet by mouth daily. Patient not taking: Reported on 05/27/2016 03/31/16   Gardiner Barefootomer, Robert W, MD  furosemide (LASIX) 20 MG tablet Take 30 mg by mouth daily.     [provider]  hydrOXYzine (ATARAX/VISTARIL) 50 MG tablet Take 50 mg by mouth 2 (two) times daily.     [provider]  lamoTRIgine (LAMICTAL) 25 MG tablet Take 25 mg by mouth 2 (two) times daily.     [provider]  lisinopril (PRINIVIL,ZESTRIL) 20 MG tablet Take 20 mg by mouth daily.    [provider]  meloxicam (MOBIC) 15 MG tablet Take 1 tablet (15 mg total) by mouth daily. TAKE WITH MEALS 05/27/16   Street, [place] Lake[place], PA-C  Multiple Vitamins-Minerals (MULTIVITAMIN WITH MINERALS) tablet Take 1 tablet by  mouth daily.    [provider]  omeprazole (PRILOSEC) 20 MG capsule Take 20 mg by mouth daily.    [provider]  QUEtiapine (SEROQUEL XR) 300 MG 24 hr tablet Take 300 mg by mouth at bedtime.    [provider]    Family History History reviewed. No pertinent family history.  Social History Social History  Substance Use Topics  . Smoking status: Current Every Day Smoker    Packs/day: 1.00  . Smokeless tobacco: Never Used  . Alcohol use 0.0 oz/week     Comment: in recovery     Allergies   Morphine and related and Penicillins   Review of Systems Review of  Systems Cannot obtain level V caveat confusion.  Physical Exam Updated Vital Signs BP (!) 167/110   Pulse 87   Temp (!) 97.2 F (36.2 C) (Axillary)   Resp 16   SpO2 98%   Physical Exam  Constitutional:  The patient is somnolent with appearance of confusion and obtundation. Her eyes will open periodically. She is muttering intermittently.  HENT:  Head: Normocephalic and atraumatic.  Nose: Nose normal.  No appearance of any facial trauma or head trauma. mucous membranes are dry.  Eyes: Conjunctivae are normal.  Pupils are symmetric and midrange. Reactive to light. Eye motions are conjugate but roving.  Neck: Neck supple.  Cardiovascular: Normal rate, regular rhythm, normal heart sounds and intact distal pulses.   Pulmonary/Chest: Effort normal and breath sounds normal.  Abdominal: Soft. She exhibits no distension.  Abdomen is soft. It is not distended. Patient is not exhibiting guarding or tenderness. He does not appear to have ascites.  Musculoskeletal:  Patient is intermittently spontaneously moving all extremities. No evident deformities or acute traumatic injuries or abrasions or contusions. No peripheral edema. Patient does have some well-healed linear cuts on forearms consistent with distant cutting. No evident track marks.  Neurological:  Patient is obtunded. She is variably somewhat agitated and somnolent. She vaguely follow some commands to move extremities. She monitors intermittently intelligible speech. This is not situationally oriented. Patient has generally agitated movements of extremities. She is not having seizure. Appearance is consistent with delirium.  Skin: Skin is warm and dry.     ED Treatments / Results  Labs (all labs ordered are listed, but only abnormal results are displayed) Labs Reviewed  COMPREHENSIVE METABOLIC PANEL - Abnormal; Notable for the following:       Result Value   Glucose, Bld 118 (*)    ALT 10 (*)    All other components within  normal limits  ACETAMINOPHEN LEVEL - Abnormal; Notable for the following:    Acetaminophen (Tylenol), Serum <10 (*)    All other components within normal limits  BLOOD GAS, ARTERIAL - Abnormal; Notable for the following:    pO2, Arterial 60.9 (*)    All other components within normal limits  I-STAT BETA HCG BLOOD, ED ([facility], [facility], [facility] ONLY) - Abnormal; Notable for the following:    I-stat hCG, quantitative 7.3 (*)    All other components within normal limits  CULTURE, BLOOD (ROUTINE X 2)  CULTURE, BLOOD (ROUTINE X 2)  ETHANOL  LIPASE, BLOOD  SALICYLATE LEVEL  CBC WITH DIFFERENTIAL/PLATELET  PROTIME-INR  AMMONIA  URINALYSIS, ROUTINE W REFLEX MICROSCOPIC  RAPID URINE DRUG SCREEN, HOSP PERFORMED  HCG, QUANTITATIVE, PREGNANCY  I-STAT CG4 LACTIC ACID, ED  I-STAT TROPONIN, ED    EKG  EKG Interpretation None       Radiology Ct Head Wo  Contrast  Result Date: 07/17/2017 CLINICAL DATA:  Acute onset of altered mental status. Question of overdose. Initial encounter. EXAM: CT HEAD WITHOUT CONTRAST TECHNIQUE: Contiguous axial images were obtained from the base of the skull through the vertex without intravenous contrast. COMPARISON:  CT of the head performed 04/25/2011 FINDINGS: Brain: No evidence of acute infarction, hemorrhage, hydrocephalus, extra-axial collection or mass lesion/mass effect. Chronic lacunar infarcts are noted at the left basal ganglia. The posterior fossa, including the cerebellum, brainstem and fourth ventricle, is within normal limits. The third and lateral ventricles are unremarkable in appearance. The cerebral hemispheres are symmetric in appearance, with normal gray-white differentiation. No mass effect or midline shift is seen. Vascular: No hyperdense vessel or unexpected calcification. Skull: There is no evidence of fracture; visualized osseous structures are unremarkable in appearance. Sinuses/Orbits: The orbits are within normal limits. The paranasal sinuses and mastoid air  cells are well-aerated. Other: No significant soft tissue abnormalities are seen. IMPRESSION: 1. No acute intracranial pathology seen on CT. 2. Chronic lacunar infarcts at the left basal ganglia. Electronically Signed   By: Roanna Raider M.D.   On: 07/17/2017 23:21   Dg Chest Port 1 View  Result Date: 07/17/2017 CLINICAL DATA:  Acute onset of altered mental status. Question of overdose. Initial encounter. EXAM: PORTABLE CHEST 1 VIEW COMPARISON:  Chest radiograph performed 07/22/2015 FINDINGS: The lungs are well-aerated. Mild bibasilar atelectasis is noted. There is no evidence of pleural effusion or pneumothorax. The cardiomediastinal silhouette is within normal limits. No acute osseous abnormalities are seen. IMPRESSION: Mild bibasilar atelectasis noted.  Lungs otherwise grossly clear Electronically Signed   By: Roanna Raider M.D.   On: 07/17/2017 22:41    Procedures Procedures (including critical care time) CRITICAL CARE Performed by: Arby Barrette   Total critical care time:30 minutes  Critical care time was exclusive of separately billable procedures and treating other patients.  Critical care was necessary to treat or prevent imminent or life-threatening deterioration.  Critical care was time spent personally by me on the following activities: development of treatment plan with patient and/or surrogate as well as nursing, discussions with consultants, evaluation of patient's response to treatment, examination of patient, obtaining history from patient or surrogate, ordering and performing treatments and interventions, ordering and review of laboratory studies, ordering and review of radiographic studies, pulse oximetry and re-evaluation of patient's condition. Medications Ordered in ED Medications  0.9 %  sodium chloride infusion ( Intravenous New Bag/Given 07/17/17 2305)     Initial Impression / Assessment and Plan / ED Course  I have reviewed the triage vital signs and the nursing  notes.  Pertinent labs & imaging results that were available during my care of the patient were reviewed by me and considered in my medical decision making (see chart for details).    Consult: [facility] for admission Dr. Antionette Char  Final Clinical Impressions(s) / ED Diagnoses   Final diagnoses:  Delirium  Severe comorbid illness   Patient presents with all and above. Airway remains protected. EKG does not show any interval changes to suggest trycyclic overdose. Patient's history does not suggest acute infectious etiology and lab work does not either. At this time I have low suspicion for sepsis. Patient's degree of delirium and responsiveness are suggestive of drug ingestion. I most suspect overdose of uncertain substance, whether intentional or unintentional is unclear. No family members or companions have as yet been to the emergency department to assist in history.  New Prescriptions New Prescriptions   No medications  on file     Arby BarrettePfeiffer, Mars Scheaffer, MD 07/18/17 0010

## 2017-07-17 NOTE — ED Triage Notes (Signed)
Pt comes from home via EMS after a roommate called them for altered mental status.  Roommate was concerned for possible overdose.  He last talked to her at 0400 yesterday.  Pt was reportedly "fine" at that time.  Pt was found by roommate lying in bed all day.  Hard to arouse.  Pt's roommate found an empty bottle of amitriptyline in bed.  That bottle was filled in August of last year and patient had full recent bottles found in purse.  12 lead unremarkable in route. Vitals in route 160/90, HR 100, RR 16, CBG 77, O2 98. Pt arousable to voice and able to follow commands.  Pt does have active tremors.

## 2017-07-18 ENCOUNTER — Encounter (HOSPITAL_COMMUNITY): Payer: Self-pay | Admitting: Family Medicine

## 2017-07-18 ENCOUNTER — Observation Stay (HOSPITAL_COMMUNITY): Payer: Self-pay

## 2017-07-18 DIAGNOSIS — B182 Chronic viral hepatitis C: Secondary | ICD-10-CM

## 2017-07-18 DIAGNOSIS — F1910 Other psychoactive substance abuse, uncomplicated: Secondary | ICD-10-CM

## 2017-07-18 DIAGNOSIS — G9340 Encephalopathy, unspecified: Secondary | ICD-10-CM | POA: Diagnosis present

## 2017-07-18 DIAGNOSIS — F314 Bipolar disorder, current episode depressed, severe, without psychotic features: Secondary | ICD-10-CM

## 2017-07-18 DIAGNOSIS — I10 Essential (primary) hypertension: Secondary | ICD-10-CM

## 2017-07-18 LAB — URINALYSIS, ROUTINE W REFLEX MICROSCOPIC
BILIRUBIN URINE: NEGATIVE
GLUCOSE, UA: NEGATIVE mg/dL
Ketones, ur: 5 mg/dL — AB
NITRITE: NEGATIVE
PROTEIN: NEGATIVE mg/dL
SPECIFIC GRAVITY, URINE: 1.021 (ref 1.005–1.030)
pH: 5 (ref 5.0–8.0)

## 2017-07-18 LAB — BLOOD GAS, ARTERIAL
Acid-Base Excess: 0 mmol/L (ref 0.0–2.0)
Bicarbonate: 23.2 mmol/L (ref 20.0–28.0)
FIO2: 21
O2 Saturation: 95.6 %
PH ART: 7.444 (ref 7.350–7.450)
Patient temperature: 98.6
pCO2 arterial: 34.4 mmHg (ref 32.0–48.0)
pO2, Arterial: 77.4 mmHg — ABNORMAL LOW (ref 83.0–108.0)

## 2017-07-18 LAB — BASIC METABOLIC PANEL
Anion gap: 9 (ref 5–15)
BUN: 13 mg/dL (ref 6–20)
CO2: 24 mmol/L (ref 22–32)
Calcium: 8.8 mg/dL — ABNORMAL LOW (ref 8.9–10.3)
Chloride: 109 mmol/L (ref 101–111)
Creatinine, Ser: 0.81 mg/dL (ref 0.44–1.00)
GFR calc Af Amer: >60 mL/min (ref >60)
GLUCOSE: 96 mg/dL (ref 65–99)
POTASSIUM: 3 mmol/L — AB (ref 3.5–5.1)
Sodium: 142 mmol/L (ref 135–145)

## 2017-07-18 LAB — CBC
HEMATOCRIT: 39 % (ref 36.0–46.0)
Hemoglobin: 13.3 g/dL (ref 12.0–15.0)
MCH: 30.6 pg (ref 26.0–34.0)
MCHC: 34.1 g/dL (ref 30.0–36.0)
MCV: 89.7 fL (ref 78.0–100.0)
Platelets: 247 10*3/uL (ref 150–400)
RBC: 4.35 MIL/uL (ref 3.87–5.11)
RDW: 12.9 % (ref 11.5–15.5)
WBC: 10.9 10*3/uL — ABNORMAL HIGH (ref 4.0–10.5)

## 2017-07-18 LAB — HIV ANTIBODY (ROUTINE TESTING W REFLEX): HIV SCREEN 4TH GENERATION: NONREACTIVE

## 2017-07-18 LAB — RAPID URINE DRUG SCREEN, HOSP PERFORMED
AMPHETAMINES: POSITIVE — AB
Barbiturates: NOT DETECTED
Benzodiazepines: NOT DETECTED
Cocaine: POSITIVE — AB
OPIATES: NOT DETECTED
TETRAHYDROCANNABINOL: NOT DETECTED

## 2017-07-18 LAB — GLUCOSE, CAPILLARY: GLUCOSE-CAPILLARY: 90 mg/dL (ref 65–99)

## 2017-07-18 LAB — HCG, QUANTITATIVE, PREGNANCY: HCG, BETA CHAIN, QUANT, S: 4 m[IU]/mL (ref <5)

## 2017-07-18 LAB — RPR: RPR Ser Ql: NONREACTIVE

## 2017-07-18 LAB — TSH: TSH: 0.55 u[IU]/mL (ref 0.350–4.500)

## 2017-07-18 LAB — MRSA PCR SCREENING: MRSA BY PCR: NEGATIVE

## 2017-07-18 LAB — VITAMIN B12: VITAMIN B 12: 429 pg/mL (ref 180–914)

## 2017-07-18 MED ORDER — QUETIAPINE FUMARATE ER 300 MG PO TB24
300.0000 mg | ORAL_TABLET | Freq: Every day | ORAL | Status: DC
Start: 2017-07-19 — End: 2017-07-18

## 2017-07-18 MED ORDER — PANTOPRAZOLE SODIUM 40 MG PO TBEC
40.0000 mg | DELAYED_RELEASE_TABLET | Freq: Every day | ORAL | Status: DC
  Filled 2017-07-18: qty 1

## 2017-07-18 MED ORDER — LAMOTRIGINE 25 MG PO TABS: 25.0000 mg | ORAL_TABLET | Freq: Two times a day (BID) | ORAL | Status: DC

## 2017-07-18 MED ORDER — DOXEPIN HCL 25 MG PO CAPS: 25.0000 mg | ORAL_CAPSULE | Freq: Every day | ORAL | Status: DC

## 2017-07-18 MED ORDER — HYDRALAZINE HCL 20 MG/ML IJ SOLN
10.0000 mg | INTRAMUSCULAR | Status: DC | PRN
  Administered 2017-07-18 – 2017-07-19 (×3): 10 mg via INTRAVENOUS
  Filled 2017-07-18: qty 1
  Filled 2017-07-18: qty 0.5
  Filled 2017-07-18: qty 1

## 2017-07-18 MED ORDER — CIPROFLOXACIN IN D5W 400 MG/200ML IV SOLN
400.0000 mg | Freq: Two times a day (BID) | INTRAVENOUS | Status: DC
  Administered 2017-07-18: 400 mg via INTRAVENOUS
  Filled 2017-07-18: qty 200

## 2017-07-18 MED ORDER — CITALOPRAM HYDROBROMIDE 10 MG PO TABS
20.0000 mg | ORAL_TABLET | Freq: Every day | ORAL | Status: DC
  Filled 2017-07-18: qty 2

## 2017-07-18 MED ORDER — SODIUM CHLORIDE 0.9% FLUSH
3.0000 mL | Freq: Two times a day (BID) | INTRAVENOUS | Status: DC
  Administered 2017-07-19 – 2017-07-21 (×4): 3 mL via INTRAVENOUS

## 2017-07-18 MED ORDER — THIAMINE HCL 100 MG/ML IJ SOLN
100.0000 mg | Freq: Every day | INTRAMUSCULAR | Status: DC
  Administered 2017-07-18 – 2017-07-20 (×3): 100 mg via INTRAVENOUS
  Filled 2017-07-18 (×3): qty 2

## 2017-07-18 MED ORDER — MELOXICAM 15 MG PO TABS
15.0000 mg | ORAL_TABLET | Freq: Every day | ORAL | Status: DC
  Filled 2017-07-18: qty 1

## 2017-07-18 MED ORDER — VITAMINS A & D EX OINT
TOPICAL_OINTMENT | CUTANEOUS | Status: AC
  Administered 2017-07-18: 03:00:00
  Filled 2017-07-18: qty 5

## 2017-07-18 MED ORDER — BISACODYL 5 MG PO TBEC: 5.0000 mg | DELAYED_RELEASE_TABLET | Freq: Every day | ORAL | Status: DC | PRN

## 2017-07-18 MED ORDER — LORAZEPAM 1 MG PO TABS: 1.0000 mg | ORAL_TABLET | Freq: Four times a day (QID) | ORAL | Status: DC | PRN

## 2017-07-18 MED ORDER — VITAMIN B-1 100 MG PO TABS: 100.0000 mg | ORAL_TABLET | Freq: Every day | ORAL | Status: DC

## 2017-07-18 MED ORDER — ADULT MULTIVITAMIN W/MINERALS CH
1.0000 | ORAL_TABLET | Freq: Every day | ORAL | Status: DC
  Administered 2017-07-21 – 2017-07-22 (×2): 1 via ORAL
  Filled 2017-07-18 (×2): qty 1

## 2017-07-18 MED ORDER — FOLIC ACID 1 MG PO TABS: 1.0000 mg | ORAL_TABLET | Freq: Every day | ORAL | Status: DC

## 2017-07-18 MED ORDER — ONDANSETRON HCL 4 MG PO TABS: 4.0000 mg | ORAL_TABLET | Freq: Four times a day (QID) | ORAL | Status: DC | PRN

## 2017-07-18 MED ORDER — POTASSIUM CHLORIDE CRYS ER 20 MEQ PO TBCR
40.0000 meq | EXTENDED_RELEASE_TABLET | Freq: Once | ORAL | Status: DC
  Filled 2017-07-18: qty 2

## 2017-07-18 MED ORDER — ORAL CARE MOUTH RINSE
15.0000 mL | Freq: Two times a day (BID) | OROMUCOSAL | Status: DC
  Administered 2017-07-19 – 2017-07-21 (×5): 15 mL via OROMUCOSAL

## 2017-07-18 MED ORDER — ENOXAPARIN SODIUM 40 MG/0.4ML ~~LOC~~ SOLN
40.0000 mg | SUBCUTANEOUS | Status: DC
  Administered 2017-07-18 – 2017-07-22 (×5): 40 mg via SUBCUTANEOUS
  Filled 2017-07-18 (×6): qty 0.4

## 2017-07-18 MED ORDER — ACETAMINOPHEN 650 MG RE SUPP: 650.0000 mg | Freq: Four times a day (QID) | RECTAL | Status: DC | PRN

## 2017-07-18 MED ORDER — LISINOPRIL 20 MG PO TABS
20.0000 mg | ORAL_TABLET | Freq: Every day | ORAL | Status: DC
  Filled 2017-07-18: qty 1

## 2017-07-18 MED ORDER — SODIUM CHLORIDE 0.9 % IV SOLN
INTRAVENOUS | Status: DC
  Administered 2017-07-18 – 2017-07-19 (×3): via INTRAVENOUS

## 2017-07-18 MED ORDER — SENNOSIDES-DOCUSATE SODIUM 8.6-50 MG PO TABS: 1.0000 | ORAL_TABLET | Freq: Every evening | ORAL | Status: DC | PRN

## 2017-07-18 MED ORDER — SODIUM CHLORIDE 0.9 % IV SOLN: INTRAVENOUS | Status: DC

## 2017-07-18 MED ORDER — DEXTROSE 5 % IV SOLN
1.0000 g | Freq: Three times a day (TID) | INTRAVENOUS | Status: DC
  Administered 2017-07-18 – 2017-07-20 (×5): 1 g via INTRAVENOUS
  Filled 2017-07-18 (×8): qty 1

## 2017-07-18 MED ORDER — SODIUM CHLORIDE 0.9 % IV SOLN: 250.0000 mL | INTRAVENOUS | Status: DC | PRN

## 2017-07-18 MED ORDER — ONDANSETRON HCL 4 MG/2ML IJ SOLN: 4.0000 mg | Freq: Four times a day (QID) | INTRAMUSCULAR | Status: DC | PRN

## 2017-07-18 MED ORDER — LORAZEPAM 2 MG/ML IJ SOLN
1.0000 mg | Freq: Four times a day (QID) | INTRAMUSCULAR | Status: DC | PRN
  Administered 2017-07-18 – 2017-07-20 (×7): 1 mg via INTRAVENOUS
  Filled 2017-07-18 (×8): qty 1

## 2017-07-18 MED ORDER — SODIUM CHLORIDE 0.9% FLUSH: 3.0000 mL | INTRAVENOUS | Status: DC | PRN

## 2017-07-18 MED ORDER — ACETAMINOPHEN 325 MG PO TABS: 650.0000 mg | ORAL_TABLET | Freq: Four times a day (QID) | ORAL | Status: DC | PRN

## 2017-07-18 MED ORDER — POTASSIUM CHLORIDE 10 MEQ/100ML IV SOLN
10.0000 meq | INTRAVENOUS | Status: AC
  Administered 2017-07-18 (×3): 10 meq via INTRAVENOUS
  Filled 2017-07-18 (×3): qty 100

## 2017-07-18 MED ORDER — ADULT MULTIVITAMIN W/MINERALS CH
1.0000 | ORAL_TABLET | Freq: Every day | ORAL | Status: DC
  Filled 2017-07-18: qty 1

## 2017-07-18 NOTE — Progress Notes (Addendum)
Patient has continued to be lethargic. Uneasy to arouse. Speech garbled. Pt is not alert, unable to give any PO's. VS 70, 82, 131/112. Provider is aware

## 2017-07-18 NOTE — Plan of Care (Signed)
Problem: Safety: Goal: Ability to remain free from injury will improve Outcome: Completed/Met Date Met: 07/18/17 Bed alarm has been set to assist in safety. Pt is non verbal/ not easy to arouse at this time

## 2017-07-18 NOTE — Progress Notes (Signed)
Pt due to void. Has not voided since arrival to unit. Bladder scan performed. 925 ccs of urine retained. Will insert foley per MD order. Will continue to monitor.

## 2017-07-18 NOTE — Progress Notes (Signed)
Presently unable to complete health history. Pt remains lethargic. Will follow up.

## 2017-07-18 NOTE — Consult Note (Signed)
NEURO HOSPITALIST CONSULT NOTE   Requestig physician: Dr. Sunnie Nielsenegalado   Reason for Consult: AMS   History obtained from:   Chart     HPI:                                                                                                                                          Kelly Kelley is an 51 y.o. female "with medical history significant for polysubstance abuse, bipolar disorder, anxiety, hypertension, and chronic hepatitis C, now presenting to the emergency department after her roommate was having difficulty waking her. Roommate reported speaking with the patient at approximately 4 AM on 07/17/2017 and reported that she was in her usual state at that time. He went off to work couple hours later, returning this afternoon to find her sound asleep, noting that it was unusual for her to have gone to bed so early. She continued to sleep and he was having difficulty waking her, he became concerned for a possible overdose, and EMS was called." Urinalysis suggests a possible infection and UDS is positive for amphetamines and cocaine. Patient transfered to ICU due to need for closer observation.   Currently she is very agitated, yelling for other people, attempting to get out of the bed, at rest shows polymyoclonus.   Past Medical History:  Diagnosis Date  . Anxiety   . Asthma   . Bipolar disorder (HCC)   . Borderline personality disorder   . Essential hypertension 03/31/2016  . Insomnia   . PTSD (post-traumatic stress disorder)   . Substance abuse     Past Surgical History:  Procedure Laterality Date  . CESAREAN SECTION    . TUBAL LIGATION      Family History  Problem Relation Age of Onset  . Hypertension Mother      Social History:  reports that she has been smoking.  She has been smoking about 1.00 pack per day. She has never used smokeless tobacco. She reports that she drinks alcohol. She reports that she uses drugs, including Cocaine.  Allergies   Allergen Reactions  . Morphine And Related Itching and Rash  . Penicillins Hives    Has patient had a PCN reaction causing immediate rash, facial/tongue/throat swelling, SOB or lightheadedness with hypotension: no Has patient had a PCN reaction causing severe rash involving mucus membranes or skin necrosis: no Has patient had a PCN reaction that required hospitalization : yes Has patient had a PCN reaction occurring within the last 10 years: no If all of the above answers are "NO", then may proceed with Cephalosporin use.     MEDICATIONS:  Prior to Admission:  Prescriptions Prior to Admission  Medication Sig Dispense Refill Last Dose  . albuterol (PROVENTIL HFA;VENTOLIN HFA) 108 (90 Base) MCG/ACT inhaler Inhale 2 puffs into the lungs every 6 (six) hours as needed for wheezing or shortness of breath.   05/27/2016 at Unknown time  . amitriptyline (ELAVIL) 50 MG tablet Take 50 mg by mouth 2 (two) times daily.   05/27/2016 at Unknown time  . citalopram (CELEXA) 20 MG tablet Take 20 mg by mouth daily.   05/27/2016 at Unknown time  . doxepin (SINEQUAN) 25 MG capsule Take 25 mg by mouth at bedtime.   Past Month at Unknown time  . Elbasvir-Grazoprevir (ZEPATIER) 50-100 MG TABS Take 1 tablet by mouth daily. (Patient not taking: Reported on 05/27/2016) 28 tablet 2 Completed Course at Unknown time  . furosemide (LASIX) 20 MG tablet Take 30 mg by mouth daily.    05/27/2016 at Unknown time  . hydrOXYzine (ATARAX/VISTARIL) 50 MG tablet Take 50 mg by mouth 2 (two) times daily.    05/27/2016 at Unknown time  . lamoTRIgine (LAMICTAL) 25 MG tablet Take 25 mg by mouth 2 (two) times daily.    05/27/2016 at Unknown time  . lisinopril (PRINIVIL,ZESTRIL) 20 MG tablet Take 20 mg by mouth daily.   05/27/2016 at Unknown time  . meloxicam (MOBIC) 15 MG tablet Take 1 tablet (15 mg total) by mouth daily. TAKE WITH  MEALS 30 tablet 0   . Multiple Vitamins-Minerals (MULTIVITAMIN WITH MINERALS) tablet Take 1 tablet by mouth daily.   05/27/2016 at Unknown time  . omeprazole (PRILOSEC) 20 MG capsule Take 20 mg by mouth daily.   05/27/2016 at Unknown time  . QUEtiapine (SEROQUEL XR) 300 MG 24 hr tablet Take 300 mg by mouth at bedtime.   05/26/2016 at Unknown time   Scheduled: . enoxaparin (LOVENOX) injection  40 mg Subcutaneous Q24H  . folic acid  1 mg Oral Daily  . [START ON 07/19/2017] lamoTRIgine  25 mg Oral BID  . mouth rinse  15 mL Mouth Rinse BID  . multivitamin with minerals  1 tablet Oral Daily  . multivitamin with minerals  1 tablet Oral Daily  . pantoprazole  40 mg Oral Daily  . potassium chloride  40 mEq Oral Once  . sodium chloride flush  3 mL Intravenous Q12H  . sodium chloride flush  3 mL Intravenous Q12H  . thiamine  100 mg Oral Daily   Or  . thiamine  100 mg Intravenous Daily     ROS:                                                                                                                                       History obtained from unobtainable from patient due to mental status    Blood pressure (!) 131/112, pulse 70, temperature 98.5 F (36.9 C), temperature source Oral, resp. rate 20, height 5\' 3"  (  1.6 m), weight 68.3 kg (150 lb 9.2 oz), SpO2 100 %.   Neurologic Examination:                                                                                                      HEENT-  Normocephalic, no lesions, without obvious abnormality.  Normal external eye and conjunctiva.  Normal TM's bilaterally.  Normal auditory canals and external ears. Normal external nose, mucus membranes and septum.  Normal pharynx. Cardiovascular- S1, S2 normal, pulses palpable throughout   Lungs- chest clear, no wheezing, rales, normal symmetric air entry, Heart exam - S1, S2 normal, no murmur, no gallop, rate regular Abdomen- normal findings: bowel sounds normal Extremities- no edema Lymph-no  adenopathy palpable Musculoskeletal-no joint tenderness, deformity or swelling Skin-warm and dry, no hyperpigmentation, vitiligo, or suspicious lesions  Neurological Examination Mental Status: Agitated, not oriented, aggressive.  Speech dysarthric but talking very fast and hard to understand.  without evidence of aphasia.  Refuses to follow  commands. Cranial Nerves: II: blinks to threat bilaterally to the best of exam as she will not open eye fully,  III,IV, VI: ptosis not present, extra-ocular motions intact bilaterally pupils equal, round, reactive to light and accommodation V,VII: smile symmetric, facial light touch sensation normal bilaterally VIII: hearing normal bilaterally IX,X: uvula rises symmetrically XI: bilateral shoulder shrug XII: midline tongue extension Motor: Right : Upper extremity   5/5    Left:     Upper extremity   5/5  Lower extremity   5/5     Lower extremity   5/5 Tone and bulk:normal tone throughout; no atrophy noted Sensory: Pinprick and light touch intact throughout, bilaterally Deep Tendon Reflexes: 2+ and symmetric throughout Plantars: Right: downgoing   Left: downgoing Cerebellar: Unable to assess due to agitation Gait: not tested      Lab Results: Basic Metabolic Panel:  Recent Labs Lab 07/17/17 2220 07/18/17 0505  NA 143 142  K 3.5 3.0*  CL 107 109  CO2 24 24  GLUCOSE 118* 96  BUN 15 13  CREATININE 0.80 0.81  CALCIUM 9.3 8.8*    Liver Function Tests:  Recent Labs Lab 07/17/17 2220  AST 21  ALT 10*  ALKPHOS 62  BILITOT 1.0  PROT 7.3  ALBUMIN 4.2    Recent Labs Lab 07/17/17 2220  LIPASE 21    Recent Labs Lab 07/17/17 2222  AMMONIA 24    CBC:  Recent Labs Lab 07/17/17 2220 07/18/17 0505  WBC 6.7 10.9*  NEUTROABS 5.5  --   HGB 14.0 13.3  HCT 40.2 39.0  MCV 89.3 89.7  PLT 265 247    Cardiac Enzymes: No results for input(s): CKTOTAL, CKMB, CKMBINDEX, TROPONINI in the last 168 hours.  Lipid Panel: No  results for input(s): CHOL, TRIG, HDL, CHOLHDL, VLDL, LDLCALC in the last 168 hours.  CBG:  Recent Labs Lab 07/18/17 1012  GLUCAP 90    Microbiology: No results found for this or any previous visit.  Coagulation Studies:  Recent Labs  07/17/17 2220  LABPROT 13.6  INR 1.04    Imaging:  Ct Head Wo Contrast  Result Date: 07/17/2017 CLINICAL DATA:  Acute onset of altered mental status. Question of overdose. Initial encounter. EXAM: CT HEAD WITHOUT CONTRAST TECHNIQUE: Contiguous axial images were obtained from the base of the skull through the vertex without intravenous contrast. COMPARISON:  CT of the head performed 04/25/2011 FINDINGS: Brain: No evidence of acute infarction, hemorrhage, hydrocephalus, extra-axial collection or mass lesion/mass effect. Chronic lacunar infarcts are noted at the left basal ganglia. The posterior fossa, including the cerebellum, brainstem and fourth ventricle, is within normal limits. The third and lateral ventricles are unremarkable in appearance. The cerebral hemispheres are symmetric in appearance, with normal gray-white differentiation. No mass effect or midline shift is seen. Vascular: No hyperdense vessel or unexpected calcification. Skull: There is no evidence of fracture; visualized osseous structures are unremarkable in appearance. Sinuses/Orbits: The orbits are within normal limits. The paranasal sinuses and mastoid air cells are well-aerated. Other: No significant soft tissue abnormalities are seen. IMPRESSION: 1. No acute intracranial pathology seen on CT. 2. Chronic lacunar infarcts at the left basal ganglia. Electronically Signed   By: Roanna Raider M.D.   On: 07/17/2017 23:21   Dg Chest Port 1 View  Result Date: 07/17/2017 CLINICAL DATA:  Acute onset of altered mental status. Question of overdose. Initial encounter. EXAM: PORTABLE CHEST 1 VIEW COMPARISON:  Chest radiograph performed 07/22/2015 FINDINGS: The lungs are well-aerated. Mild bibasilar  atelectasis is noted. There is no evidence of pleural effusion or pneumothorax. The cardiomediastinal silhouette is within normal limits. No acute osseous abnormalities are seen. IMPRESSION: Mild bibasilar atelectasis noted.  Lungs otherwise grossly clear Electronically Signed   By: Roanna Raider M.D.   On: 07/17/2017 22:41       Assessment and plan per attending neurologist  Felicie Morn PA-C [facility] [phone]  07/18/2017, 12:48 PM  I have seen the patient reviewed the above note. She does follow some commands for me, she repeatedly calls me a"dirt bag"and appears to respond to a formed hallucination in the room, interacting with a person I do not see.  Apparently, she left a note indicating that she was making and intentional effort to kill herself.  Assessment/Plan: Confusion and AMS in setting of attempted overdose with cocaine, amphetamines, unclear if other substances or playing a role. 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 Either way, I think that care is predominately supportive. Given that she was normal the day prior, and the fact that there was apparently a clear intent to kill herself, I think that other causes unrelated to substance abuse are relatively unlikely.   1) EEG 2) MRI brain 3) neurology will follow  Ritta Slot, MD [facility] [phone]  If 7pm- 7am, please page neurology on call as listed in [facility]ION.

## 2017-07-18 NOTE — Progress Notes (Signed)
PROGRESS NOTE    Kelly Kelley  ZOX:[id number] DOB: 1966-09-25 DOA: 07/17/2017 PCP: Patient, No Pcp Per   Brief Narrative:  Kelly Kelley is a 51 y.o. female with medical history significant for polysubstance abuse, bipolar disorder, anxiety, hypertension, and chronic hepatitis C, now presenting to the emergency department after her roommate was having difficulty waking her. Roommate reported speaking with the patient at approximately 4 AM on 07/17/2017 and reported that she was in her usual state at that time. He went off to work couple hours later, returning this afternoon to find her sound asleep, noting that it was unusual for her to have gone to bed so early. She continued to sleep and he was having difficulty waking her, he became concerned for a possible overdose, and EMS was called.   ED Course: Upon arrival to the ED, patient is found to be afebrile, saturating well on room air, hypertensive to 180/115, and with vitals otherwise stable. EKG features a sinus rhythm, chest x-ray is notable only for mild bibasilar atelectasis, and noncontrast head CT is negative for acute intracranial abnormality. Chemistry panels unremarkable, CBC is within normal limits, lactic acid is reassuring at 0.76, INR and troponin are normal, and ammonia level was also normal. Acetaminophen, salicylates, and ethanol levels are undetectable. Urinalysis suggests a possible infection and UDS is positive for amphetamines and cocaine. Blood cultures were obtained in the ED and the patient was started on IV fluid hydration. She remained hemodynamically stable and in no apparent distress, though difficult to arouse, and will be observed on telemetry unit for ongoing evaluation and management of acute encephalopathy.    Assessment & Plan:   Principal Problem:   Acute encephalopathy Active Problems:   Chronic hepatitis C without hepatic coma (HCC)   Generalized anxiety disorder   Substance abuse   Severe depressed  bipolar I disorder (HCC)   Essential hypertension  1-Acute encephalopathy;  Unclear etiology, patient has been afebrile, UDS positive for cocaine, amphetamine.  B 12 normal at 429, TSH 0.550. CT head no acute abnormalities, chronic lacunar infarct in basal ganglia. RPR; folate pending. HIV pending.  Patient is still lethargic. Plan to transfer to step down unit for closer monitoring of mental status.  Will check MRI, EEG.  Neurology consulted.  UA with too numerous to count, change antibiotics from cipro to aztreonam.  Will hold sedatives.  Monitor on ciwa, history of alcohol. But Will hold ativan for sedation.  Start Oxygen supplementation.   2-UTI; follow urine culture.  Start Aztreonam.   3-hypokalemia; replete IV.   4-Bipolar; hold Sinequan, Lamictal, Celexa, and Elavil as her condition allows  5-Hypoxemia; repeat chest x ray to evaluate for aspiration.    HTN; hold lisinopril, unable to take oral.  IV hydralazine PRN.   Chronic hepatitis C  - Was seen by ID in April 2017 and started on 12-week course of Zepatier, not clear if she completed or not  - No evidence for cirrhosis   DVT prophylaxis: Lovenox  Code Status: Full code.  Family Communication: none at bedside.  Disposition Plan: transfer to step down unit for monitor.    Consultants:   Neurology.    Procedures:   EEG;   Antimicrobials:   Received one dose of ciprofloxacin.   Aztreonam 8-06   Subjective: Patient is still lethargic. Mumble, unable to understand speech.  She keeps eyes close, try to reach over thing with her hands   Objective: Vitals:   07/18/17 0104 07/18/17 0151 07/18/17 0400  07/18/17 0942  BP: (!) 140/105 (!) 179/125 (!) 114/103   Pulse: 84 79 92   Resp: 19 20 (!) 24 20  Temp:  (!) 97.5 F (36.4 C) 97.7 F (36.5 C) 98.5 F (36.9 C)  TempSrc:  Axillary Axillary Oral  SpO2: 95% 97% 100%   Weight:  68.3 kg (150 lb 9.2 oz)    Height:  5\' 3"  (1.6 m)      Intake/Output  Summary (Last 24 hours) at 07/18/17 0945 Last data filed at 07/18/17 5409  Gross per 24 hour  Intake           318.75 ml  Output              200 ml  Net           118.75 ml   Filed Weights   07/18/17 0151  Weight: 68.3 kg (150 lb 9.2 oz)    Examination:  General exam: lethargic, protecting air way.  Respiratory system: Normal respiratory effort, decreased breath sounds.  Cardiovascular system: S1 & S2 heard, RRR. No JVD, murmurs, rubs, gallops or clicks. No pedal edema. Gastrointestinal system: Abdomen is nondistended, soft and nontender. No organomegaly or masses felt. Normal bowel sounds heard. Central nervous system: lethargic, not following command.  Extremities: generalized weakness.  Skin: No rashes, lesions or ulcers     Data Reviewed: I have personally reviewed following labs and imaging studies  CBC:  Recent Labs Lab 07/17/17 2220 07/18/17 0505  WBC 6.7 10.9*  NEUTROABS 5.5  --   HGB 14.0 13.3  HCT 40.2 39.0  MCV 89.3 89.7  PLT 265 247   Basic Metabolic Panel:  Recent Labs Lab 07/17/17 2220 07/18/17 0505  NA 143 142  K 3.5 3.0*  CL 107 109  CO2 24 24  GLUCOSE 118* 96  BUN 15 13  CREATININE 0.80 0.81  CALCIUM 9.3 8.8*   GFR: Estimated Creatinine Clearance: 76.3 mL/min (by C-G formula based on SCr of 0.81 mg/dL). Liver Function Tests:  Recent Labs Lab 07/17/17 2220  AST 21  ALT 10*  ALKPHOS 62  BILITOT 1.0  PROT 7.3  ALBUMIN 4.2    Recent Labs Lab 07/17/17 2220  LIPASE 21    Recent Labs Lab 07/17/17 2222  AMMONIA 24   Coagulation Profile:  Recent Labs Lab 07/17/17 2220  INR 1.04   Cardiac Enzymes: No results for input(s): CKTOTAL, CKMB, CKMBINDEX, TROPONINI in the last 168 hours. BNP (last 3 results) No results for input(s): PROBNP in the last 8760 hours. HbA1C: No results for input(s): HGBA1C in the last 72 hours. CBG: No results for input(s): GLUCAP in the last 168 hours. Lipid Profile: No results for input(s):  CHOL, HDL, LDLCALC, TRIG, CHOLHDL, LDLDIRECT in the last 72 hours. Thyroid Function Tests:  Recent Labs  07/17/17 2350  TSH 0.550   Anemia Panel:  Recent Labs  07/17/17 2350  VITAMINB12 429   Sepsis Labs:  Recent Labs Lab 07/17/17 2250  LATICACIDVEN 0.76    No results found for this or any previous visit (from the past 240 hour(s)).       Radiology Studies: Ct Head Wo Contrast  Result Date: 07/17/2017 CLINICAL DATA:  Acute onset of altered mental status. Question of overdose. Initial encounter. EXAM: CT HEAD WITHOUT CONTRAST TECHNIQUE: Contiguous axial images were obtained from the base of the skull through the vertex without intravenous contrast. COMPARISON:  CT of the head performed 04/25/2011 FINDINGS: Brain: No evidence of acute infarction, hemorrhage, hydrocephalus,  extra-axial collection or mass lesion/mass effect. Chronic lacunar infarcts are noted at the left basal ganglia. The posterior fossa, including the cerebellum, brainstem and fourth ventricle, is within normal limits. The third and lateral ventricles are unremarkable in appearance. The cerebral hemispheres are symmetric in appearance, with normal gray-white differentiation. No mass effect or midline shift is seen. Vascular: No hyperdense vessel or unexpected calcification. Skull: There is no evidence of fracture; visualized osseous structures are unremarkable in appearance. Sinuses/Orbits: The orbits are within normal limits. The paranasal sinuses and mastoid air cells are well-aerated. Other: No significant soft tissue abnormalities are seen. IMPRESSION: 1. No acute intracranial pathology seen on CT. 2. Chronic lacunar infarcts at the left basal ganglia. Electronically Signed   By: Roanna RaiderJeffery  Chang M.D.   On: 07/17/2017 23:21   Dg Chest Port 1 View  Result Date: 07/17/2017 CLINICAL DATA:  Acute onset of altered mental status. Question of overdose. Initial encounter. EXAM: PORTABLE CHEST 1 VIEW COMPARISON:  Chest  radiograph performed 07/22/2015 FINDINGS: The lungs are well-aerated. Mild bibasilar atelectasis is noted. There is no evidence of pleural effusion or pneumothorax. The cardiomediastinal silhouette is within normal limits. No acute osseous abnormalities are seen. IMPRESSION: Mild bibasilar atelectasis noted.  Lungs otherwise grossly clear Electronically Signed   By: Roanna RaiderJeffery  Chang M.D.   On: 07/17/2017 22:41        Scheduled Meds: . enoxaparin (LOVENOX) injection  40 mg Subcutaneous Q24H  . [START ON 07/19/2017] lamoTRIgine  25 mg Oral BID  . mouth rinse  15 mL Mouth Rinse BID  . multivitamin with minerals  1 tablet Oral Daily  . pantoprazole  40 mg Oral Daily  . potassium chloride  40 mEq Oral Once  . sodium chloride flush  3 mL Intravenous Q12H  . sodium chloride flush  3 mL Intravenous Q12H   Continuous Infusions: . sodium chloride    . sodium chloride 75 mL/hr at 07/18/17 0225  . ciprofloxacin Stopped (07/18/17 0322)     LOS: 0 days    Time spent: 35 minutes.     Alba Coryegalado, Rosali Augello A, MD [facility] Pager [phone]  If 7PM-7AM, please contact night-coverage [email or web address] Password [email or web address] 07/18/2017, 9:45 AM

## 2017-07-18 NOTE — ED Notes (Signed)
ED TO INPATIENT HANDOFF REPORT  Name/Age/Gender Kelly Kelley 51 y.o. female  Home/SNF/Other Home  Chief Complaint Altered Mental Status     Code Status Orders        Start     Ordered   07/18/17 0009  Full code  Continuous     07/18/17 0011    Code Status History    Date Active Date Inactive Code Status Order ID Comments User Context   This patient has a current code status but no historical code status.      Level of Care/Admitting Diagnosis ED Disposition    ED Disposition Condition [facility] Area: [place] [[id number]]  Level of Care: Telemetry [5]  Admit to tele based on following criteria: Other see comments  Comments: ?ingestion  Diagnosis: Acute encephalopathy [825003]  Admitting Physician: Vianne Bulls [[id number]]  Attending Physician: Vianne Bulls [[id number]]  PT Class (Do Not Modify): Observation [104]  PT Acc Code (Do Not Modify): Observation [10022]       Medical History Past Medical History:  Diagnosis Date  . Anxiety   . Asthma   . Bipolar disorder ([place])   . Borderline personality disorder   . Essential hypertension 03/31/2016  . Insomnia   . PTSD (post-traumatic stress disorder)   . Substance abuse     Allergies Allergies  Allergen Reactions  . Morphine And Related Itching and Rash  . Penicillins Hives    Has patient had a PCN reaction causing immediate rash, facial/tongue/throat swelling, SOB or lightheadedness with hypotension: no Has patient had a PCN reaction causing severe rash involving mucus membranes or skin necrosis: no Has patient had a PCN reaction that required hospitalization : yes Has patient had a PCN reaction occurring within the last 10 years: no If all of the above answers are "NO", then may proceed with Cephalosporin use.     IV Location/Drains/Wounds Patient Lines/Drains/Airways Status   Active Line/Drains/Airways    None          Labs/Imaging Results for orders  placed or performed during the hospital encounter of 07/17/17 (from the past 48 hour(s))  Comprehensive metabolic panel     Status: Abnormal   Collection Time: 07/17/17 10:20 PM  Result Value Ref Range   Sodium 143 135 - 145 mmol/L   Potassium 3.5 3.5 - 5.1 mmol/L   Chloride 107 101 - 111 mmol/L   CO2 24 22 - 32 mmol/L   Glucose, Bld 118 (H) 65 - 99 mg/dL   BUN 15 6 - 20 mg/dL   Creatinine, Ser 0.80 0.44 - 1.00 mg/dL   Calcium 9.3 8.9 - 10.3 mg/dL   Total Protein 7.3 6.5 - 8.1 g/dL   Albumin 4.2 3.5 - 5.0 g/dL   AST 21 15 - 41 U/L   ALT 10 (L) 14 - 54 U/L   Alkaline Phosphatase 62 38 - 126 U/L   Total Bilirubin 1.0 0.3 - 1.2 mg/dL   GFR calc non Af Amer >60 >60 mL/min   GFR calc Af Amer >60 >60 mL/min    Comment: (NOTE) The eGFR has been calculated using the CKD EPI equation. This calculation has not been validated in all clinical situations. eGFR's persistently <60 mL/min signify possible Chronic Kidney Disease.    Anion gap 12 5 - 15  Acetaminophen level     Status: Abnormal   Collection Time: 07/17/17 10:20 PM  Result Value Ref Range   Acetaminophen (Tylenol), Serum <10 (  L) 10 - 30 ug/mL    Comment:        THERAPEUTIC CONCENTRATIONS VARY SIGNIFICANTLY. A RANGE OF 10-30 ug/mL MAY BE AN EFFECTIVE CONCENTRATION FOR MANY PATIENTS. HOWEVER, SOME ARE BEST TREATED AT CONCENTRATIONS OUTSIDE THIS RANGE. ACETAMINOPHEN CONCENTRATIONS >150 ug/mL AT 4 HOURS AFTER INGESTION AND >50 ug/mL AT 12 HOURS AFTER INGESTION ARE OFTEN ASSOCIATED WITH TOXIC REACTIONS.   Ethanol     Status: None   Collection Time: 07/17/17 10:20 PM  Result Value Ref Range   Alcohol, Ethyl (B) <5 <5 mg/dL    Comment:        LOWEST DETECTABLE LIMIT FOR SERUM ALCOHOL IS 5 mg/dL FOR MEDICAL PURPOSES ONLY   Lipase, blood     Status: None   Collection Time: 07/17/17 10:20 PM  Result Value Ref Range   Lipase 21 11 - 51 U/L  Salicylate level     Status: None   Collection Time: 07/17/17 10:20 PM  Result  Value Ref Range   Salicylate Lvl <0.3 2.8 - 30.0 mg/dL  CBC with Differential     Status: None   Collection Time: 07/17/17 10:20 PM  Result Value Ref Range   WBC 6.7 4.0 - 10.5 K/uL   RBC 4.50 3.87 - 5.11 MIL/uL   Hemoglobin 14.0 12.0 - 15.0 g/dL   HCT 40.2 36.0 - 46.0 %   MCV 89.3 78.0 - 100.0 fL   MCH 31.1 26.0 - 34.0 pg   MCHC 34.8 30.0 - 36.0 g/dL   RDW 12.8 11.5 - 15.5 %   Platelets 265 150 - 400 K/uL   Neutrophils Relative % 82 %   Neutro Abs 5.5 1.7 - 7.7 K/uL   Lymphocytes Relative 14 %   Lymphs Abs 0.9 0.7 - 4.0 K/uL   Monocytes Relative 4 %   Monocytes Absolute 0.3 0.1 - 1.0 K/uL   Eosinophils Relative 0 %   Eosinophils Absolute 0.0 0.0 - 0.7 K/uL   Basophils Relative 0 %   Basophils Absolute 0.0 0.0 - 0.1 K/uL  Protime-INR     Status: None   Collection Time: 07/17/17 10:20 PM  Result Value Ref Range   Prothrombin Time 13.6 11.4 - 15.2 seconds   INR 1.04   Blood gas, arterial     Status: Abnormal   Collection Time: 07/17/17 10:21 PM  Result Value Ref Range   FIO2 21.00    pH, Arterial 7.425 7.350 - 7.450   pCO2 arterial 38.8 32.0 - 48.0 mmHg   pO2, Arterial 60.9 (L) 83.0 - 108.0 mmHg   Bicarbonate 25.2 20.0 - 28.0 mmol/L   Acid-Base Excess 1.2 0.0 - 2.0 mmol/L   O2 Saturation 92.2 %   Patient temperature 97.5    Collection site RIGHT RADIAL    Drawn by [phone]    Sample type ARTERIAL DRAW    Allens test (pass/fail) PASS PASS  Ammonia     Status: None   Collection Time: 07/17/17 10:22 PM  Result Value Ref Range   Ammonia 24 9 - 35 umol/L  I-Stat beta hCG blood, ED     Status: Abnormal   Collection Time: 07/17/17 10:45 PM  Result Value Ref Range   I-stat hCG, quantitative 7.3 (H) <5 mIU/mL   Comment 3            Comment:   GEST. AGE      CONC.  (mIU/mL)   <=1 WEEK        5 - 50  2 WEEKS       50 - 500     3 WEEKS       100 - 10,000     4 WEEKS     1,000 - 30,000        FEMALE AND NON-PREGNANT FEMALE:     LESS THAN 5 mIU/mL   I-stat troponin, ED      Status: None   Collection Time: 07/17/17 10:45 PM  Result Value Ref Range   Troponin i, poc 0.00 0.00 - 0.08 ng/mL   Comment 3            Comment: Due to the release kinetics of cTnI, a negative result within the first hours of the onset of symptoms does not rule out myocardial infarction with certainty. If myocardial infarction is still suspected, repeat the test at appropriate intervals.   I-Stat CG4 Lactic Acid, ED     Status: None   Collection Time: 07/17/17 10:50 PM  Result Value Ref Range   Lactic Acid, Venous 0.76 0.5 - 1.9 mmol/L  Urinalysis, Routine w reflex microscopic     Status: Abnormal   Collection Time: 07/17/17 11:36 PM  Result Value Ref Range   Color, Urine AMBER (A) YELLOW    Comment: BIOCHEMICALS MAY BE AFFECTED BY COLOR   APPearance HAZY (A) CLEAR   Specific Gravity, Urine 1.021 1.005 - 1.030   pH 5.0 5.0 - 8.0   Glucose, UA NEGATIVE NEGATIVE mg/dL   Hgb urine dipstick SMALL (A) NEGATIVE   Bilirubin Urine NEGATIVE NEGATIVE   Ketones, ur 5 (A) NEGATIVE mg/dL   Protein, ur NEGATIVE NEGATIVE mg/dL   Nitrite NEGATIVE NEGATIVE   Leukocytes, UA MODERATE (A) NEGATIVE   RBC / HPF 0-5 0 - 5 RBC/hpf   WBC, UA TOO NUMEROUS TO COUNT 0 - 5 WBC/hpf   Bacteria, UA MANY (A) NONE SEEN   Squamous Epithelial / LPF 0-5 (A) NONE SEEN   Mucous PRESENT   Urine rapid drug screen (hosp performed)     Status: Abnormal   Collection Time: 07/17/17 11:37 PM  Result Value Ref Range   Opiates NONE DETECTED NONE DETECTED   Cocaine POSITIVE (A) NONE DETECTED   Benzodiazepines NONE DETECTED NONE DETECTED   Amphetamines POSITIVE (A) NONE DETECTED   Tetrahydrocannabinol NONE DETECTED NONE DETECTED   Barbiturates NONE DETECTED NONE DETECTED    Comment:        DRUG SCREEN FOR MEDICAL PURPOSES ONLY.  IF CONFIRMATION IS NEEDED FOR ANY PURPOSE, NOTIFY LAB WITHIN 5 DAYS.        LOWEST DETECTABLE LIMITS FOR URINE DRUG SCREEN Drug Class       Cutoff (ng/mL) Amphetamine       1000 Barbiturate      200 Benzodiazepine   333 Tricyclics       545 Opiates          300 Cocaine          300 THC              50   hCG, quantitative, pregnancy     Status: None   Collection Time: 07/17/17 11:37 PM  Result Value Ref Range   hCG, Beta Chain, Quant, S 4 <5 mIU/mL    Comment:          GEST. AGE      CONC.  (mIU/mL)   <=1 WEEK        5 - 50     2 WEEKS  50 - 500     3 WEEKS       100 - 10,000     4 WEEKS     1,000 - 30,000     5 WEEKS     3,500 - 115,000   6-8 WEEKS     12,000 - 270,000    12 WEEKS     15,000 - 220,000        FEMALE AND NON-PREGNANT FEMALE:     LESS THAN 5 mIU/mL   TSH     Status: None   Collection Time: 07/17/17 11:50 PM  Result Value Ref Range   TSH 0.550 0.350 - 4.500 uIU/mL    Comment: Performed by a 3rd Generation assay with a functional sensitivity of <=0.01 uIU/mL.   Ct Head Wo Contrast  Result Date: 07/17/2017 CLINICAL DATA:  Acute onset of altered mental status. Question of overdose. Initial encounter. EXAM: CT HEAD WITHOUT CONTRAST TECHNIQUE: Contiguous axial images were obtained from the base of the skull through the vertex without intravenous contrast. COMPARISON:  CT of the head performed 04/25/2011 FINDINGS: Brain: No evidence of acute infarction, hemorrhage, hydrocephalus, extra-axial collection or mass lesion/mass effect. Chronic lacunar infarcts are noted at the left basal ganglia. The posterior fossa, including the cerebellum, brainstem and fourth ventricle, is within normal limits. The third and lateral ventricles are unremarkable in appearance. The cerebral hemispheres are symmetric in appearance, with normal gray-white differentiation. No mass effect or midline shift is seen. Vascular: No hyperdense vessel or unexpected calcification. Skull: There is no evidence of fracture; visualized osseous structures are unremarkable in appearance. Sinuses/Orbits: The orbits are within normal limits. The paranasal sinuses and mastoid air cells  are well-aerated. Other: No significant soft tissue abnormalities are seen. IMPRESSION: 1. No acute intracranial pathology seen on CT. 2. Chronic lacunar infarcts at the left basal ganglia. Electronically Signed   By: Garald Balding M.D.   On: 07/17/2017 23:21   Dg Chest Port 1 View  Result Date: 07/17/2017 CLINICAL DATA:  Acute onset of altered mental status. Question of overdose. Initial encounter. EXAM: PORTABLE CHEST 1 VIEW COMPARISON:  Chest radiograph performed 07/22/2015 FINDINGS: The lungs are well-aerated. Mild bibasilar atelectasis is noted. There is no evidence of pleural effusion or pneumothorax. The cardiomediastinal silhouette is within normal limits. No acute osseous abnormalities are seen. IMPRESSION: Mild bibasilar atelectasis noted.  Lungs otherwise grossly clear Electronically Signed   By: Garald Balding M.D.   On: 07/17/2017 22:41    Pending Labs Unresulted Labs    Start     Ordered   07/25/17 0500  Creatinine, serum  (enoxaparin (LOVENOX)    CrCl >/= 30 ml/min)  Weekly,   R    Comments:  while on enoxaparin therapy    07/18/17 0011   07/18/17 0500  HIV antibody (Routine Testing)  Tomorrow morning,   R     07/18/17 0011   07/18/17 6734  Basic metabolic panel  Tomorrow morning,   R     07/18/17 0011   07/18/17 0500  CBC  Tomorrow morning,   R     07/18/17 0011   07/18/17 0011  Vitamin B12  Add-on,   R     07/18/17 0011   07/18/17 0011  Folate RBC  Add-on,   R     07/18/17 0011   07/18/17 0011  RPR  Add-on,   R     07/18/17 0011   07/17/17 2221  Culture, blood (routine x 2)  BLOOD  CULTURE X 2,   STAT     07/17/17 2221      Isolation Precautions No active isolations  Vitals/Pain Today's Vitals   07/17/17 2224 07/17/17 2337 07/18/17 0000 07/18/17 0104  BP: (!) 142/115 (!) 178/118 (!) 167/110 (!) 140/105  Pulse: 93 86 87 84  Resp: _0 Temp: (!) 97.2 F (36.2 C)     TempSrc: Axillary     SpO2: 97% 97% 98% 95%    Medications Medications  citalopram  (CELEXA) tablet 20 mg (not administered)  doxepin (SINEQUAN) capsule 25 mg (not administered)  lisinopril (PRINIVIL,ZESTRIL) tablet 20 mg (not administered)  meloxicam (MOBIC) tablet 15 mg (not administered)  multivitamin with minerals tablet 1 tablet (not administered)  QUEtiapine (SEROQUEL XR) 24 hr tablet 300 mg (not administered)  lamoTRIgine (LAMICTAL) tablet 25 mg (not administered)  pantoprazole (PROTONIX) EC tablet 40 mg (not administered)  enoxaparin (LOVENOX) injection 40 mg (not administered)  sodium chloride flush (NS) 0.9 % injection 3 mL (not administered)  sodium chloride flush (NS) 0.9 % injection 3 mL (not administered)  sodium chloride flush (NS) 0.9 % injection 3 mL (not administered)  0.9 %  sodium chloride infusion (not administered)  acetaminophen (TYLENOL) tablet 650 mg (not administered)    Or  acetaminophen (TYLENOL) suppository 650 mg (not administered)  senna-docusate (Senokot-S) tablet 1 tablet (not administered)  bisacodyl (DULCOLAX) EC tablet 5 mg (not administered)  ondansetron (ZOFRAN) tablet 4 mg (not administered)    Or  ondansetron (ZOFRAN) injection 4 mg (not administered)  hydrALAZINE (APRESOLINE) injection 10 mg (not administered)  0.9 %  sodium chloride infusion ( Intravenous New Bag/Given 07/17/17 2305)    Mobility Walks at baseline

## 2017-07-18 NOTE — Progress Notes (Signed)
Rn may call report to [place], RN at 01:13 at [phone]

## 2017-07-18 NOTE — Progress Notes (Signed)
Phone call received from Neurology PA to await provider arrival  Before transporting to MRI. Report given to ICU RN.

## 2017-07-18 NOTE — H&P (Signed)
History and Physical    Kelly Kelley WUJ:[id number] DOB: 10-04-1966 DOA: 07/17/2017  PCP: Patient, No Pcp Per   Patient coming from: Home  Chief Complaint: Difficult to wake   HPI: Kelly Kelley is a 51 y.o. female with medical history significant for polysubstance abuse, bipolar disorder, anxiety, hypertension, and chronic hepatitis C, now presenting to the emergency department after her roommate was having difficulty waking her. Roommate reported speaking with the patient at approximately 4 AM on 07/17/2017 and reported that she was in her usual state at that time. He went off to work couple hours later, returning this afternoon to find her sound asleep, noting that it was unusual for her to have gone to bed so early. She continued to sleep and he was having difficulty waking her, he became concerned for a possible overdose, and EMS was called.   ED Course: Upon arrival to the ED, patient is found to be afebrile, saturating well on room air, hypertensive to 180/115, and with vitals otherwise stable. EKG features a sinus rhythm, chest x-ray is notable only for mild bibasilar atelectasis, and noncontrast head CT is negative for acute intracranial abnormality. Chemistry panels unremarkable, CBC is within normal limits, lactic acid is reassuring at 0.76, INR and troponin are normal, and ammonia level was also normal. Acetaminophen, salicylates, and ethanol levels are undetectable. Urinalysis suggests a possible infection and UDS is positive for amphetamines and cocaine. Blood cultures were obtained in the ED and the patient was started on IV fluid hydration. She remained hemodynamically stable and in no apparent distress, though difficult to arouse, and will be observed on telemetry unit for ongoing evaluation and management of acute encephalopathy.  Review of Systems:  Unable to complete ROS secondary to patient's clinical condition with encephalopathy.   Past Medical History:  Diagnosis Date   . Anxiety   . Asthma   . Bipolar disorder (HCC)   . Borderline personality disorder   . Essential hypertension 03/31/2016  . Insomnia   . PTSD (post-traumatic stress disorder)   . Substance abuse     Past Surgical History:  Procedure Laterality Date  . CESAREAN SECTION    . TUBAL LIGATION       reports that she has been smoking.  She has been smoking about 1.00 pack per day. She has never used smokeless tobacco. She reports that she drinks alcohol. She reports that she uses drugs, including Cocaine.  Allergies  Allergen Reactions  . Morphine And Related Itching and Rash  . Penicillins Hives    Has patient had a PCN reaction causing immediate rash, facial/tongue/throat swelling, SOB or lightheadedness with hypotension: no Has patient had a PCN reaction causing severe rash involving mucus membranes or skin necrosis: no Has patient had a PCN reaction that required hospitalization : yes Has patient had a PCN reaction occurring within the last 10 years: no If all of the above answers are "NO", then may proceed with Cephalosporin use.     History reviewed. No pertinent family history.   Prior to Admission medications   Medication Sig Start Date End Date Taking? Authorizing Provider  albuterol (PROVENTIL HFA;VENTOLIN HFA) 108 (90 Base) MCG/ACT inhaler Inhale 2 puffs into the lungs every 6 (six) hours as needed for wheezing or shortness of breath.    [provider]  amitriptyline (ELAVIL) 50 MG tablet Take 50 mg by mouth 2 (two) times daily.    [provider]  citalopram (CELEXA) 20 MG tablet Take 20 mg by  mouth daily.    [provider]  doxepin (SINEQUAN) 25 MG capsule Take 25 mg by mouth at bedtime.    [provider]  Elbasvir-Grazoprevir (ZEPATIER) 50-100 MG TABS Take 1 tablet by mouth daily. Patient not taking: Reported on 05/27/2016 03/31/16   Gardiner Barefoot, MD  furosemide (LASIX) 20 MG tablet Take 30 mg by mouth daily.     [provider]  hydrOXYzine (ATARAX/VISTARIL) 50 MG tablet Take 50 mg by mouth 2 (two) times daily.     [provider]  lamoTRIgine (LAMICTAL) 25 MG tablet Take 25 mg by mouth 2 (two) times daily.     [provider]  lisinopril (PRINIVIL,ZESTRIL) 20 MG tablet Take 20 mg by mouth daily.    [provider]  meloxicam (MOBIC) 15 MG tablet Take 1 tablet (15 mg total) by mouth daily. TAKE WITH MEALS 05/27/16   Street, [place], PA-C  Multiple Vitamins-Minerals (MULTIVITAMIN WITH MINERALS) tablet Take 1 tablet by mouth daily.    [provider]  omeprazole (PRILOSEC) 20 MG capsule Take 20 mg by mouth daily.    [provider]  QUEtiapine (SEROQUEL XR) 300 MG 24 hr tablet Take 300 mg by mouth at bedtime.    [provider]    Physical Exam: Vitals:   07/17/17 2224 07/17/17 2337 07/18/17 0000  BP: (!) 142/115 (!) 178/118 (!) 167/110  Pulse: 93 86 87  Resp: 16 16   Temp: (!) 97.2 F (36.2 C)    TempSrc: Axillary    SpO2: 97% 97% 98%      Constitutional: NAD, calm, somnolent Eyes: No icterus, lids and conjunctivae normal ENMT: Mucous membranes are moist. Posterior pharynx clear of any exudate or lesions.   Neck: normal, supple, no masses, no thyromegaly Respiratory: clear to auscultation bilaterally, no wheezing, no crackles. Normal respiratory effort.  Cardiovascular: S1 & S2 heard, regular rate and rhythm. No extremity edema. No significant JVD. Abdomen: No distension, no tenderness, no masses palpated. Bowel sounds normal.  Musculoskeletal: no clubbing / cyanosis. No joint deformity upper and lower extremities.  Skin: no significant rashes, lesions, ulcers. Warm, dry, well-perfused. Neurologic: No facial asymmetry. Pupils equal, round, and sluggishly reactive. Patellar DTR's normal.  Psychiatric: Difficult to assess given the clinical scenario.     Labs on Admission: I have personally reviewed following labs and imaging  studies  CBC:  Recent Labs Lab 07/17/17 2220  WBC 6.7  NEUTROABS 5.5  HGB 14.0  HCT 40.2  MCV 89.3  PLT 265   Basic Metabolic Panel:  Recent Labs Lab 07/17/17 2220  NA 143  K 3.5  CL 107  CO2 24  GLUCOSE 118*  BUN 15  CREATININE 0.80  CALCIUM 9.3   GFR: CrCl cannot be calculated (Unknown ideal weight.). Liver Function Tests:  Recent Labs Lab 07/17/17 2220  AST 21  ALT 10*  ALKPHOS 62  BILITOT 1.0  PROT 7.3  ALBUMIN 4.2    Recent Labs Lab 07/17/17 2220  LIPASE 21    Recent Labs Lab 07/17/17 2222  AMMONIA 24   Coagulation Profile:  Recent Labs Lab 07/17/17 2220  INR 1.04   Cardiac Enzymes: No results for input(s): CKTOTAL, CKMB, CKMBINDEX, TROPONINI in the last 168 hours. BNP (last 3 results) No results for input(s): PROBNP in the last 8760 hours. HbA1C: No results for input(s): HGBA1C in the last 72 hours. CBG: No results for input(s): GLUCAP in the last 168 hours. Lipid Profile: No results for input(s): CHOL,  HDL, LDLCALC, TRIG, CHOLHDL, LDLDIRECT in the last 72 hours. Thyroid Function Tests: No results for input(s): TSH, T4TOTAL, FREET4, T3FREE, THYROIDAB in the last 72 hours. Anemia Panel: No results for input(s): VITAMINB12, FOLATE, FERRITIN, TIBC, IRON, RETICCTPCT in the last 72 hours. Urine analysis:    Component Value Date/Time   COLORURINE YELLOW 07/22/2015 1325   APPEARANCEUR CLEAR 07/22/2015 1325   LABSPEC 1.005 07/22/2015 1325   PHURINE 6.5 07/22/2015 1325   GLUCOSEU NEGATIVE 07/22/2015 1325   HGBUR NEGATIVE 07/22/2015 1325   BILIRUBINUR NEGATIVE 07/22/2015 1325   KETONESUR NEGATIVE 07/22/2015 1325   PROTEINUR NEGATIVE 07/22/2015 1325   UROBILINOGEN 0.2 07/22/2015 1325   NITRITE NEGATIVE 07/22/2015 1325   LEUKOCYTESUR NEGATIVE 07/22/2015 1325   Sepsis Labs: @LABRCNTIP (procalcitonin:4,lacticidven:4) )No results found for this or any previous visit (from the past 240 hour(s)).   Radiological Exams on Admission: Ct  Head Wo Contrast  Result Date: 07/17/2017 CLINICAL DATA:  Acute onset of altered mental status. Question of overdose. Initial encounter. EXAM: CT HEAD WITHOUT CONTRAST TECHNIQUE: Contiguous axial images were obtained from the base of the skull through the vertex without intravenous contrast. COMPARISON:  CT of the head performed 04/25/2011 FINDINGS: Brain: No evidence of acute infarction, hemorrhage, hydrocephalus, extra-axial collection or mass lesion/mass effect. Chronic lacunar infarcts are noted at the left basal ganglia. The posterior fossa, including the cerebellum, brainstem and fourth ventricle, is within normal limits. The third and lateral ventricles are unremarkable in appearance. The cerebral hemispheres are symmetric in appearance, with normal gray-white differentiation. No mass effect or midline shift is seen. Vascular: No hyperdense vessel or unexpected calcification. Skull: There is no evidence of fracture; visualized osseous structures are unremarkable in appearance. Sinuses/Orbits: The orbits are within normal limits. The paranasal sinuses and mastoid air cells are well-aerated. Other: No significant soft tissue abnormalities are seen. IMPRESSION: 1. No acute intracranial pathology seen on CT. 2. Chronic lacunar infarcts at the left basal ganglia. Electronically Signed   By: Roanna Raider M.D.   On: 07/17/2017 23:21   Dg Chest Port 1 View  Result Date: 07/17/2017 CLINICAL DATA:  Acute onset of altered mental status. Question of overdose. Initial encounter. EXAM: PORTABLE CHEST 1 VIEW COMPARISON:  Chest radiograph performed 07/22/2015 FINDINGS: The lungs are well-aerated. Mild bibasilar atelectasis is noted. There is no evidence of pleural effusion or pneumothorax. The cardiomediastinal silhouette is within normal limits. No acute osseous abnormalities are seen. IMPRESSION: Mild bibasilar atelectasis noted.  Lungs otherwise grossly clear Electronically Signed   By: Roanna Raider M.D.   On:  07/17/2017 22:41    EKG: Independently reviewed. Sinus rhythm.   Assessment/Plan  1. Acute encephalopathy  - EMS was called by patient's roommate after she was difficult to wake; he saw her in her usual state at 4 am on 07/17/17  - ED workup with basic labs, EKG, CXR, head CT, and UA notable only for possible UTI and UDS positive for amphetamines and cocaine  - Suspect this is secondary to ingestion, UTI less-likely etiology  - Plan to check TSH, RPR, ammonia, B12, and folate - Send urine for culture and start empiric Cipro in setting of penicillin allergy  2. Polysubstance abuse  - UDS positive for amphetamines and cocaine  - Social work consultation requested    3. Bipolar disorder  - Unable to assess given the clinical scenario  - Resume Sinequan, Lamictal, Celexa, and Elavil as her condition allows; not appropriate for a diet on admission    4. Hypertension  -  BP elevated in ED, possibly secondary to amphetamines, cocaine  - Resume lisinopril when she is appropriate for a diet   - Treat with hydralazine IVP's prn   5. Chronic hepatitis C  - Was seen by ID in April 2017 and started on 12-week course of Zepatier, not clear if she completed or not  - No evidence for cirrhosis     DVT prophylaxis: sq Lovenox  Code Status: Full  Family Communication: Discussed with patient Disposition Plan: Observe on telemetry Consults called: None Admission status: Observation    Briscoe Deutscherimothy S Ambry Dix, MD [facility] Pager [phone]  If 7PM-7AM, please contact night-coverage [email or web address] Password [email or web address]  07/18/2017, 12:11 AM

## 2017-07-18 NOTE — Progress Notes (Signed)
Patient assessed this am. Pt is not easy to arouse, responds to pain sternal rub. Pt was verbal, speech garbled.

## 2017-07-18 NOTE — Progress Notes (Signed)
Pt refusing and uncooperative for EEG today. Will attempt again on 8/7 as schedule permits

## 2017-07-18 NOTE — Progress Notes (Signed)
Pharmacy Antibiotic Note  Kelly Kelley is a 51 y.o. female admitted on 07/17/2017 with UTI.  Pharmacy has been consulted for aztreonam dosing.  Patient has history of penicillin allergy that required hospitalization.  No history of cephalosporin use per chart review and MD would like to avoid Cipro due to potential of contributing to AMS.  Patient difficult to awake, MD wants empiric IV antibiotics for possible UTI.  Plan: Aztreonam 1g IV q8h. F/u culture results. After patient awakes, f/u for potential to adjust antibiotics to an oral regimen or discontinue antibiotics if patient was not having urinary symptoms PTA.  Height: 5\' 3"  (160 cm) Weight: 150 lb 9.2 oz (68.3 kg) IBW/kg (Calculated) : 52.4  Temp (24hrs), Avg:97.7 F (36.5 C), Min:97.2 F (36.2 C), Max:98.5 F (36.9 C)   Recent Labs Lab 07/17/17 2220 07/17/17 2250 07/18/17 0505  WBC 6.7  --  10.9*  CREATININE 0.80  --  0.81  LATICACIDVEN  --  0.76  --     Estimated Creatinine Clearance: 76.3 mL/min (by C-G formula based on SCr of 0.81 mg/dL).    Allergies  Allergen Reactions  . Morphine And Related Itching and Rash  . Penicillins Hives    Has patient had a PCN reaction causing immediate rash, facial/tongue/throat swelling, SOB or lightheadedness with hypotension: no Has patient had a PCN reaction causing severe rash involving mucus membranes or skin necrosis: no Has patient had a PCN reaction that required hospitalization : yes Has patient had a PCN reaction occurring within the last 10 years: no If all of the above answers are "NO", then may proceed with Cephalosporin use.     Antimicrobials this admission: 8/6 Cipro x 1 8/6 Aztreonam >>  Dose adjustments this admission:  Microbiology results: 8/5 BCx:  8/5 UCx:   Thank you for allowing pharmacy to be a part of this patient's care.  Clance BollRunyon, Gwendelyn Lanting 07/18/2017 12:06 PM

## 2017-07-18 NOTE — Progress Notes (Signed)
Unable to get a full admission history due to patient being lethargic.

## 2017-07-18 NOTE — Progress Notes (Signed)
CSW acknowledged consult for current substance abuse. CSW attempted to assess, patient lethargic and unable to participate in assessment. CSW will attempt to assess patient at a later time when patient is more alert.  Celso SickleKimberly Mahoganie Basher, [place] Clinical Social Worker [facility] Cell#: [phone]

## 2017-07-19 ENCOUNTER — Inpatient Hospital Stay (HOSPITAL_COMMUNITY)
Admit: 2017-07-19 | Discharge: 2017-07-19 | Disposition: A | Discharge status: Home / Self Care | Payer: Self-pay | Attending: Internal Medicine | Admitting: Internal Medicine

## 2017-07-19 ENCOUNTER — Inpatient Hospital Stay (HOSPITAL_COMMUNITY): Payer: Self-pay

## 2017-07-19 DIAGNOSIS — R4182 Altered mental status, unspecified: Secondary | ICD-10-CM

## 2017-07-19 LAB — CBC
HEMATOCRIT: 36.8 % (ref 36.0–46.0)
HEMOGLOBIN: 12.5 g/dL (ref 12.0–15.0)
MCH: 30.7 pg (ref 26.0–34.0)
MCHC: 34 g/dL (ref 30.0–36.0)
MCV: 90.4 fL (ref 78.0–100.0)
Platelets: 211 10*3/uL (ref 150–400)
RBC: 4.07 MIL/uL (ref 3.87–5.11)
RDW: 13.1 % (ref 11.5–15.5)
WBC: 5.9 10*3/uL (ref 4.0–10.5)

## 2017-07-19 LAB — BASIC METABOLIC PANEL
Anion gap: 9 (ref 5–15)
BUN: 13 mg/dL (ref 6–20)
CHLORIDE: 114 mmol/L — AB (ref 101–111)
CO2: 19 mmol/L — AB (ref 22–32)
CREATININE: 0.74 mg/dL (ref 0.44–1.00)
Calcium: 8.5 mg/dL — ABNORMAL LOW (ref 8.9–10.3)
GFR calc Af Amer: >60 mL/min (ref >60)
GFR calc non Af Amer: >60 mL/min (ref >60)
Glucose, Bld: 71 mg/dL (ref 65–99)
Potassium: 3.2 mmol/L — ABNORMAL LOW (ref 3.5–5.1)
Sodium: 142 mmol/L (ref 135–145)

## 2017-07-19 LAB — FOLATE RBC
FOLATE, HEMOLYSATE: 487.7 ng/mL
Folate, RBC: 1213 ng/mL (ref >498)
Hematocrit: 40.2 % (ref 34.0–46.6)

## 2017-07-19 LAB — GLUCOSE, CAPILLARY
GLUCOSE-CAPILLARY: 136 mg/dL — AB (ref 65–99)
Glucose-Capillary: 58 mg/dL — ABNORMAL LOW (ref 65–99)
Glucose-Capillary: 67 mg/dL (ref 65–99)

## 2017-07-19 LAB — LAMOTRIGINE LEVEL: Lamotrigine Lvl: 1.4 ug/mL — ABNORMAL LOW (ref 2.0–20.0)

## 2017-07-19 MED ORDER — HYDRALAZINE HCL 20 MG/ML IJ SOLN
10.0000 mg | Freq: Four times a day (QID) | INTRAMUSCULAR | Status: DC | PRN
  Administered 2017-07-19 – 2017-07-21 (×4): 10 mg via INTRAVENOUS
  Filled 2017-07-19 (×3): qty 1
  Filled 2017-07-19: qty 0.5
  Filled 2017-07-19: qty 1

## 2017-07-19 MED ORDER — PANTOPRAZOLE SODIUM 40 MG IV SOLR
40.0000 mg | Freq: Two times a day (BID) | INTRAVENOUS | Status: DC
  Administered 2017-07-19 – 2017-07-21 (×6): 40 mg via INTRAVENOUS
  Filled 2017-07-19 (×7): qty 40

## 2017-07-19 MED ORDER — FOLIC ACID 5 MG/ML IJ SOLN
1.0000 mg | Freq: Every day | INTRAMUSCULAR | Status: DC
  Administered 2017-07-19 – 2017-07-20 (×2): 1 mg via INTRAVENOUS
  Filled 2017-07-19 (×2): qty 0.2

## 2017-07-19 MED ORDER — POTASSIUM CHLORIDE 10 MEQ/100ML IV SOLN
10.0000 meq | INTRAVENOUS | Status: AC
  Administered 2017-07-19 (×3): 10 meq via INTRAVENOUS
  Filled 2017-07-19 (×3): qty 100

## 2017-07-19 MED ORDER — LORAZEPAM 2 MG/ML IJ SOLN
2.0000 mg | Freq: Once | INTRAMUSCULAR | Status: AC
  Administered 2017-07-19: 2 mg via INTRAVENOUS

## 2017-07-19 MED ORDER — DEXTROSE 50 % IV SOLN
50.0000 mL | Freq: Once | INTRAVENOUS | Status: AC
  Administered 2017-07-19: 50 mL via INTRAVENOUS
  Filled 2017-07-19: qty 50

## 2017-07-19 MED ORDER — DEXTROSE-NACL 5-0.9 % IV SOLN
INTRAVENOUS | Status: DC
  Administered 2017-07-19 – 2017-07-21 (×4): via INTRAVENOUS

## 2017-07-19 NOTE — Progress Notes (Signed)
EEG Completed; Results Pending  

## 2017-07-19 NOTE — Progress Notes (Signed)
Patient extremely agitated, kicking and pinching family members.  Screaming nonsensical words, hallucinating that a man is in the room, attempting to get OOB despite bilateral wrist restraints.  Unable to recognize family members in the room.  Unable to reorient or distract.  MD notified.  New orders obtained, will continue to monitor.

## 2017-07-19 NOTE — Procedures (Signed)
ELECTROENCEPHALOGRAM REPORT  Date of Study: 07/19/2017  Patient's Name: Kelly Kelley MRN: [id number] Date of Birth: September 30, 1966  Referring Provider: Dr. Hartley BarefootBelkys Regalado  Clinical History: This is a 51 year old woman with altered mental status.  Medications: acetaminophen (TYLENOL) tablet 650 mg  aztreonam (AZACTAM) 1 g in dextrose 5 % 50 mL IVPB  bisacodyl (DULCOLAX) EC tablet 5 mg  dextrose 5 %-0.9 % sodium chloride infusion  enoxaparin (LOVENOX) injection 40 mg  folic acid injection 1 mg  hydrALAZINE (APRESOLINE) injection 10 mg  multivitamin with minerals tablet 1 tablet  pantoprazole (PROTONIX) injection 40 mg  thiamine (B-1) injection 100 mg   Technical Summary: A multichannel digital EEG recording measured by the international 10-20 system with electrodes applied with paste and impedances below 5000 ohms performed as portable with EKG monitoring in an awake and agitated patient.  Hyperventilation and photic stimulation were not performed.  The digital EEG was referentially recorded, reformatted, and digitally filtered in a variety of bipolar and referential montages for optimal display.   Description: The patient is awake and agitated during the recording.  During maximal wakefulness, there is a poorly sustained 10 Hz posterior dominant rhythm that poorly attenuates with eye opening and eye closure. This is admixed with a small amount of diffuse low voltage 4-5 Hz theta and 2-3 Hz delta slowing of the waking background. Sleep is not captured. Hyperventilation and photic stimulation were not performed.  There were no epileptiform discharges or electrographic seizures seen.    EKG lead was unremarkable.  Impression: This awake EEG is abnormal due to mild diffuse slowing of the waking background.  Clinical Correlation of the above findings indicates diffuse cerebral dysfunction that is non-specific in etiology and can be seen with hypoxic/ischemic injury, toxic/metabolic  encephalopathies, or medication effect.  The absence of epileptiform discharges does not rule out a clinical diagnosis of epilepsy.  Clinical correlation is advised.   Patrcia DollyKaren Lorana Maffeo, M.D.

## 2017-07-19 NOTE — Care Management Note (Signed)
Case Management Note  Patient Details  Name: Yevonne AlineSharon E Largo MRN: [id number] Date of Birth: 12/15/1965  Subjective/Objective:    ams and polysubstance abuse/positive drug screen for cocain and amphetamines/uti-e.coli present/on o2, iv flds and iv abx.                Action/Plan: Date:  July 19, 2017 Chart reviewed for concurrent status and case management needs. Will continue to follow patient progress. Discharge Planning: following for needs Expected discharge date: [id number] Marcelle SmilingRhonda Davis, BSN, [place] EstatesRN3, [place]CCM   [phone]  Expected Discharge Date:                  Expected Discharge Plan:  Home/Self Care  In-House Referral:     Discharge planning Services  CM Consult  Post Acute Care Choice:    Choice offered to:     DME Arranged:    DME Agency:     HH Arranged:    HH Agency:     Status of Service:  In process, will continue to follow  If discussed at Long Length of Stay Meetings, dates discussed:    Additional Comments:  Golda AcreDavis, Rhonda Lynn, RN 07/19/2017, 8:23 AM

## 2017-07-19 NOTE — Progress Notes (Addendum)
PROGRESS NOTE    Kelly Kelley  NWG:[id number] DOB: 04-06-1966 DOA: 07/17/2017 PCP: Patient, No Pcp Per   Brief Narrative:  Kelly Kelley is a 51 y.o. female with medical history significant for polysubstance abuse, bipolar disorder, anxiety, hypertension, and chronic hepatitis C, now presenting to the emergency department after her roommate was having difficulty waking her. Roommate reported speaking with the patient at approximately 4 AM on 07/17/2017 and reported that she was in her usual state at that time. He went off to work couple hours later, returning this afternoon to find her sound asleep, noting that it was unusual for her to have gone to bed so early. She continued to sleep and he was having difficulty waking her, he became concerned for a possible overdose, and EMS was called.   ED Course: Upon arrival to the ED, patient is found to be afebrile, saturating well on room air, hypertensive to 180/115, and with vitals otherwise stable. EKG features a sinus rhythm, chest x-ray is notable only for mild bibasilar atelectasis, and noncontrast head CT is negative for acute intracranial abnormality. Chemistry panels unremarkable, CBC is within normal limits, lactic acid is reassuring at 0.76, INR and troponin are normal, and ammonia level was also normal. Acetaminophen, salicylates, and ethanol levels are undetectable. Urinalysis suggests a possible infection and UDS is positive for amphetamines and cocaine. Blood cultures were obtained in the ED and the patient was started on IV fluid hydration. She remained hemodynamically stable and in no apparent distress, though difficult to arouse, and will be observed on telemetry unit for ongoing evaluation and management of acute encephalopathy.  Patient admitted with acute encephalopathy, psychosis thought to be related to drugs.   Assessment & Plan:   Principal Problem:   Acute encephalopathy Active Problems:   Chronic hepatitis C without  hepatic coma (HCC)   Generalized anxiety disorder   Substance abuse   Severe depressed bipolar I disorder (HCC)   Essential hypertension  1-Acute encephalopathy; drug related psychosis.  patient has been afebrile, UDS positive for cocaine, amphetamine.  B 12 normal at 429, TSH 0.550. CT head no acute abnormalities, chronic lacunar infarct in basal ganglia. RPR; non reactive.  folate pending. HIV non reactive. Ammonia 24.  MRI negative for stroke, EEG  Pending.  Neurology consulted. Neurology think patient has drug related psychosis.  UA with too numerous to count, change antibiotics from cipro to aztreonam.  Monitor on ciwa, history of alcohol.  Oxygen supplementation.   2-UTI; follow urine culture. 100,000 colonies Gram negatives rods.  Continue with Aztreonam.  Follow Blood cultures.   3-Hypokalemia; replete IV.   4-Bipolar; hold Sinequan, Lamictal, Celexa, and Elavil as her condition allows  5-Hypoxemia; repeat chest x ray to evaluate for aspiration. Chest x ray 8-07 no acute abnormalities.   HTN; hold lisinopril, unable to take oral.  IV hydralazine PRN.   Chronic hepatitis C  - Was seen by ID in April 2017 and started on 12-week course of Zepatier, not clear if she completed or not  - No evidence for cirrhosis    Hypoglycemia; IV fluids D 5.   DVT prophylaxis: Lovenox  Code Status: Full code.  Family Communication: Father and brother 8-06 Disposition Plan: Continue to monitor in the step down.    Consultants:   Neurology.    Procedures:   EEG;   Antimicrobials:   Received one dose of ciprofloxacin.   Aztreonam 8-06   Subjective: Lethargic.  Agitation on and off.   Objective: Vitals:  07/19/17 1127 07/19/17 1200 07/19/17 1300 07/19/17 1400  BP:  (!) 123/105  (!) 140/95  Pulse:  (!) 103 (!) 111 (!) 103  Resp:  (!) 22 (!) 25 (!) 24  Temp: 97.7 F (36.5 C)     TempSrc: Oral     SpO2:  99% 96% 98%  Weight:      Height:        Intake/Output  Summary (Last 24 hours) at 07/19/17 1409 Last data filed at 07/19/17 1400  Gross per 24 hour  Intake           3187.5 ml  Output             1825 ml  Net           1362.5 ml   Filed Weights   07/18/17 0151  Weight: 68.3 kg (150 lb 9.2 oz)    Examination:  General exam: Lethargic, protecting airway.  Respiratory system: Normal respiratory effort, CTA Cardiovascular system: S 1,. S 2 RRR Gastrointestinal system: BS present, soft, nt Central nervous system: lethargic, not following command.  Extremities: no edema Skin: No rashes, lesions or ulcers     Data Reviewed: I have personally reviewed following labs and imaging studies  CBC:  Recent Labs Lab 07/17/17 2220 07/18/17 0505 07/19/17 0731  WBC 6.7 10.9* 5.9  NEUTROABS 5.5  --   --   HGB 14.0 13.3 12.5  HCT 40.2 39.0 36.8  MCV 89.3 89.7 90.4  PLT 265 247 211   Basic Metabolic Panel:  Recent Labs Lab 07/17/17 2220 07/18/17 0505 07/19/17 0731  NA 143 142 142  K 3.5 3.0* 3.2*  CL 107 109 114*  CO2 24 24 19*  GLUCOSE 118* 96 71  BUN 15 13 13   CREATININE 0.80 0.81 0.74  CALCIUM 9.3 8.8* 8.5*   GFR: Estimated Creatinine Clearance: 77.2 mL/min (by C-G formula based on SCr of 0.74 mg/dL). Liver Function Tests:  Recent Labs Lab 07/17/17 2220  AST 21  ALT 10*  ALKPHOS 62  BILITOT 1.0  PROT 7.3  ALBUMIN 4.2    Recent Labs Lab 07/17/17 2220  LIPASE 21    Recent Labs Lab 07/17/17 2222  AMMONIA 24   Coagulation Profile:  Recent Labs Lab 07/17/17 2220  INR 1.04   Cardiac Enzymes: No results for input(s): CKTOTAL, CKMB, CKMBINDEX, TROPONINI in the last 168 hours. BNP (last 3 results) No results for input(s): PROBNP in the last 8760 hours. HbA1C: No results for input(s): HGBA1C in the last 72 hours. CBG:  Recent Labs Lab 07/18/17 1012 07/19/17 0827 07/19/17 0940 07/19/17 1011  GLUCAP 90 58* 67 136*   Lipid Profile: No results for input(s): CHOL, HDL, LDLCALC, TRIG, CHOLHDL,  LDLDIRECT in the last 72 hours. Thyroid Function Tests:  Recent Labs  07/17/17 2350  TSH 0.550   Anemia Panel:  Recent Labs  07/17/17 2350  VITAMINB12 429   Sepsis Labs:  Recent Labs Lab 07/17/17 2250  LATICACIDVEN 0.76    Recent Results (from the past 240 hour(s))  Culture, Urine     Status: Abnormal (Preliminary result)   Collection Time: 07/17/17 11:36 PM  Result Value Ref Range Status   Specimen Description URINE, CATHETERIZED  Final   Special Requests NONE  Final   Culture >=100,000 COLONIES/mL GRAM NEGATIVE RODS (A)  Final   Report Status PENDING  Incomplete  MRSA PCR Screening     Status: None   Collection Time: 07/18/17 12:35 PM  Result Value Ref Range  Status   MRSA by PCR NEGATIVE NEGATIVE Final    Comment:        The GeneXpert MRSA Assay (FDA approved for NASAL specimens only), is one component of a comprehensive MRSA colonization surveillance program. It is not intended to diagnose MRSA infection nor to guide or monitor treatment for MRSA infections.          Radiology Studies: Ct Head Wo Contrast  Result Date: 07/17/2017 CLINICAL DATA:  Acute onset of altered mental status. Question of overdose. Initial encounter. EXAM: CT HEAD WITHOUT CONTRAST TECHNIQUE: Contiguous axial images were obtained from the base of the skull through the vertex without intravenous contrast. COMPARISON:  CT of the head performed 04/25/2011 FINDINGS: Brain: No evidence of acute infarction, hemorrhage, hydrocephalus, extra-axial collection or mass lesion/mass effect. Chronic lacunar infarcts are noted at the left basal ganglia. The posterior fossa, including the cerebellum, brainstem and fourth ventricle, is within normal limits. The third and lateral ventricles are unremarkable in appearance. The cerebral hemispheres are symmetric in appearance, with normal gray-white differentiation. No mass effect or midline shift is seen. Vascular: No hyperdense vessel or unexpected  calcification. Skull: There is no evidence of fracture; visualized osseous structures are unremarkable in appearance. Sinuses/Orbits: The orbits are within normal limits. The paranasal sinuses and mastoid air cells are well-aerated. Other: No significant soft tissue abnormalities are seen. IMPRESSION: 1. No acute intracranial pathology seen on CT. 2. Chronic lacunar infarcts at the left basal ganglia. Electronically Signed   By: Roanna RaiderJeffery  Chang M.D.   On: 07/17/2017 23:21   Mr Brain Wo Contrast  Result Date: 07/18/2017 CLINICAL DATA:  51 y/o  F; encephalopathy. EXAM: MRI HEAD WITHOUT CONTRAST TECHNIQUE: Multiplanar, multiecho pulse sequences of the brain and surrounding structures were obtained without intravenous contrast. COMPARISON:  07/17/2017 CT head FINDINGS: Brain: No acute infarction, hemorrhage, hydrocephalus, extra-axial collection or mass lesion. Few nonspecific foci of T2 FLAIR hyperintense signal abnormality in subcortical and periventricular white matter is compatible with mild chronic microvascular ischemic changes for age. Mild brain parenchymal volume loss. T2 hyperintense foci and left basal ganglia without surrounding FLAIR signal abnormality likely represent prominent perivascular spaces. Vascular: Normal flow voids. Skull and upper cervical spine: Normal marrow signal. Sinuses/Orbits: Negative. Other: None. IMPRESSION: 1. No acute intracranial abnormality. 2. Mild chronic microvascular ischemic changes and mild parenchymal volume loss of the brain. Electronically Signed   By: Mitzi HansenLance  Furusawa-Stratton M.D.   On: 07/18/2017 16:01   Dg Chest Port 1 View  Result Date: 07/19/2017 CLINICAL DATA:  Substance abuse, hypertension, asthma, altered mental status EXAM: PORTABLE CHEST 1 VIEW COMPARISON:  Portable exam 0833 hours compared 07/17/2017 FINDINGS: Normal heart size, mediastinal contours, and pulmonary vascularity. Rotated to the RIGHT. Lungs clear. No infiltrate, pleural effusion, or  pneumothorax. Osseous structures unremarkable. IMPRESSION: No acute abnormalities. Electronically Signed   By: Ulyses SouthwardMark  Boles M.D.   On: 07/19/2017 09:10   Dg Chest Port 1 View  Result Date: 07/17/2017 CLINICAL DATA:  Acute onset of altered mental status. Question of overdose. Initial encounter. EXAM: PORTABLE CHEST 1 VIEW COMPARISON:  Chest radiograph performed 07/22/2015 FINDINGS: The lungs are well-aerated. Mild bibasilar atelectasis is noted. There is no evidence of pleural effusion or pneumothorax. The cardiomediastinal silhouette is within normal limits. No acute osseous abnormalities are seen. IMPRESSION: Mild bibasilar atelectasis noted.  Lungs otherwise grossly clear Electronically Signed   By: Roanna RaiderJeffery  Chang M.D.   On: 07/17/2017 22:41        Scheduled Meds: . enoxaparin (LOVENOX)  injection  40 mg Subcutaneous Q24H  . folic acid  1 mg Intravenous Daily  . mouth rinse  15 mL Mouth Rinse BID  . multivitamin with minerals  1 tablet Oral Daily  . pantoprazole (PROTONIX) IV  40 mg Intravenous Q12H  . potassium chloride  40 mEq Oral Once  . sodium chloride flush  3 mL Intravenous Q12H  . sodium chloride flush  3 mL Intravenous Q12H  . thiamine  100 mg Oral Daily   Or  . thiamine  100 mg Intravenous Daily   Continuous Infusions: . sodium chloride    . aztreonam 1 g (07/19/17 1350)  . dextrose 5 % and 0.9% NaCl 100 mL/hr at 07/19/17 1015  . potassium chloride       LOS: 1 day    Time spent: 35 minutes.     Alba Cory, MD [facility] Pager [phone]  If 7PM-7AM, please contact night-coverage [email or web address] Password [email or web address] 07/19/2017, 2:09 PM

## 2017-07-19 NOTE — Progress Notes (Signed)
Paged [facility] for BP of 160/98. Hydralazine parameters are to give for systolic >165 and a diastolic >82>90. Nurse instructed to monitor and page again if diastolic is >100. Nurse will continue to monitor.

## 2017-07-19 NOTE — Progress Notes (Signed)
Pt unable to answer questions to complete admission assessment and unable to accept education due to altered mental status. No family present at this time to answer questions and receive education. Will continue to monitor patient and give education when able, and will provide education to family and ask admission questions when able.

## 2017-07-19 NOTE — Progress Notes (Signed)
Subjective: Currently lethargic and wanting to be left alone. Knows she is in a hospital but believes it is March.   Exam: Vitals:   07/19/17 0655 07/19/17 0800  BP:  (!) 180/162  Pulse:  91  Resp:  19  Temp: 97.8 F (36.6 C)     HEENT-  Normocephalic, no lesions, without obvious abnormality.  Normal external eye and conjunctiva.  Normal TM's bilaterally.  Normal auditory canals and external ears. Normal external nose, mucus membranes and septum.  Normal pharynx.    Neuro: lethargic, refuses to open eyes. Per nurse has been hallucinating over night and agitated.  CN: Pupils are equal and round. They are symmetrically reactive from 3-->2 mm. EOMI without nystagmus. Facial sensation is intact to light touch. Face is symmetric at rest with normal strength and mobility. Hearing is intact to conversational voice. Palate elevates symmetrically and uvula is midline. Voice is normal in tone, pitch and quality. Bilateral SCM and trapezii are 5/5. Tongue is midline with normal bulk and mobility.  Motor: Normal bulk, tone, and strength. 5/5 throughout. No drift.  Sensation: Intact to light touch.  DTRs: 3+, symmetric  Toes downgoing bilaterally. No pathologic reflexes.  Coordination: Finger-to-nose and heel-to-shin are without dysmetria     Pertinent Labs/Diagnostics: EEG attempted yesterday but patient to uncooperative.  MRI brain negative K 3.2 Ca 8.5  Kelly MornDavid Smith PA-C [facility] [phone]  I have seen the patient reviewed the above note. She appears slightly more coherent today, is able to answer more questions. She continues to have hallucinations respond to internal stimuli. The movements that I see appear to be akathisia, not polymyoclonus.  Impression:   As stated prior-Confusion and AMS in setting of attempted overdose with cocaine, amphetamines, unclear if other substances or playing a role. 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 I think that care is predominately supportive. Could use low-dose neuroleptics. I would recommend psychiatry consultation   1) psychiatric consultation 2) consider Seroquel when necessary 3) no further recommendations from neurology at this time, please call with further questions or concerns.  Ritta SlotMcNeill Kelly Eppinger, MD [facility] [phone]  If 7pm- 7am, please page neurology on call as listed in AMION.  07/19/2017, 8:59 AM

## 2017-07-20 DIAGNOSIS — R0902 Hypoxemia: Secondary | ICD-10-CM

## 2017-07-20 LAB — COMPREHENSIVE METABOLIC PANEL
ALBUMIN: 3.2 g/dL — AB (ref 3.5–5.0)
ALK PHOS: 53 U/L (ref 38–126)
ALT: 9 U/L — AB (ref 14–54)
AST: 12 U/L — AB (ref 15–41)
Anion gap: 7 (ref 5–15)
BILIRUBIN TOTAL: 0.4 mg/dL (ref 0.3–1.2)
BUN: 7 mg/dL (ref 6–20)
CALCIUM: 8.4 mg/dL — AB (ref 8.9–10.3)
CO2: 23 mmol/L (ref 22–32)
CREATININE: 0.74 mg/dL (ref 0.44–1.00)
Chloride: 112 mmol/L — ABNORMAL HIGH (ref 101–111)
GFR calc Af Amer: >60 mL/min (ref >60)
GFR calc non Af Amer: >60 mL/min (ref >60)
GLUCOSE: 99 mg/dL (ref 65–99)
Potassium: 3.3 mmol/L — ABNORMAL LOW (ref 3.5–5.1)
SODIUM: 142 mmol/L (ref 135–145)
TOTAL PROTEIN: 6 g/dL — AB (ref 6.5–8.1)

## 2017-07-20 LAB — CBC
HEMATOCRIT: 35 % — AB (ref 36.0–46.0)
HEMOGLOBIN: 12 g/dL (ref 12.0–15.0)
MCH: 30.2 pg (ref 26.0–34.0)
MCHC: 34.3 g/dL (ref 30.0–36.0)
MCV: 88.2 fL (ref 78.0–100.0)
Platelets: 230 10*3/uL (ref 150–400)
RBC: 3.97 MIL/uL (ref 3.87–5.11)
RDW: 12.9 % (ref 11.5–15.5)
WBC: 7.1 10*3/uL (ref 4.0–10.5)

## 2017-07-20 LAB — GLUCOSE, CAPILLARY: GLUCOSE-CAPILLARY: 115 mg/dL — AB (ref 65–99)

## 2017-07-20 LAB — URINE CULTURE: Culture: >=100000 — AB

## 2017-07-20 MED ORDER — AZTREONAM IN DEXTROSE 1 GM/50ML IV SOLN
1.0000 g | Freq: Three times a day (TID) | INTRAVENOUS | Status: DC
  Filled 2017-07-20: qty 50

## 2017-07-20 MED ORDER — CEFAZOLIN SODIUM-DEXTROSE 1-4 GM/50ML-% IV SOLN
1.0000 g | Freq: Three times a day (TID) | INTRAVENOUS | Status: DC
  Filled 2017-07-20 (×2): qty 50

## 2017-07-20 MED ORDER — FOLIC ACID 5 MG/ML IJ SOLN
1.0000 mg | Freq: Every day | INTRAMUSCULAR | Status: DC
  Administered 2017-07-21: 1 mg via INTRAVENOUS
  Filled 2017-07-20 (×2): qty 0.2

## 2017-07-20 MED ORDER — POTASSIUM CHLORIDE 10 MEQ/100ML IV SOLN
10.0000 meq | INTRAVENOUS | Status: AC
  Administered 2017-07-20 (×3): 10 meq via INTRAVENOUS
  Filled 2017-07-20 (×3): qty 100

## 2017-07-20 MED ORDER — THIAMINE HCL 100 MG/ML IJ SOLN
100.0000 mg | Freq: Every day | INTRAMUSCULAR | Status: DC
  Administered 2017-07-21: 100 mg via INTRAVENOUS
  Filled 2017-07-20: qty 2

## 2017-07-20 MED ORDER — LORAZEPAM 2 MG/ML IJ SOLN
2.0000 mg | INTRAMUSCULAR | Status: DC | PRN
  Administered 2017-07-20: 2 mg via INTRAVENOUS
  Filled 2017-07-20: qty 1

## 2017-07-20 MED ORDER — CEFAZOLIN SODIUM-DEXTROSE 1-4 GM/50ML-% IV SOLN
1.0000 g | Freq: Three times a day (TID) | INTRAVENOUS | Status: AC
  Administered 2017-07-20 (×2): 1 g via INTRAVENOUS
  Filled 2017-07-20 (×2): qty 50

## 2017-07-20 NOTE — Progress Notes (Signed)
MD Toniann FailKakrakandy responded to page in regards to patient being agitated and hallucinating. MD Eilene GhaziKakarandy placing new orders. Will pass along to bedside RN Dennard NipBrittany Scott.

## 2017-07-20 NOTE — Progress Notes (Signed)
PROGRESS NOTE  Kelly Kelley  ZOX:[id number] DOB: 1966/07/31 DOA: 07/17/2017 PCP: Patient, No Pcp Per   Brief Narrative: Kelly Sale Robinsonis a 51 y.o.femalewith medical history significant for polysubstance abuse, bipolar disorder, anxiety, hypertension, and chronic hepatitis C, now presenting to the emergency department after her roommate was having difficulty waking her. Roommate reported speaking with the patient at approximately 4 AM on 07/17/2017 and reported that she was in her usual state at that time. He went off to work couple hours later, returning this afternoon to find her sound asleep, noting that it was unusual for her to have gone to bed so early. She continued to sleep and he was having difficulty waking her, he became concerned for a possible overdose, and EMS was called. Upon arrival to the ED, patient is found to be afebrile, saturating well on room air, hypertensive to 180/115, and with vitals otherwise stable. EKG features a sinus rhythm, chest x-ray is notable only for mild bibasilar atelectasis, and noncontrast head CT is negative for acute intracranial abnormality. Chemistry panels unremarkable, CBC is within normal limits, lactic acid is reassuring at 0.76, INR and troponin are normal, and ammonia level was also normal. Acetaminophen, salicylates, and ethanol levels are undetectable.   Urinalysis suggests a possible infection and UDS is positive for amphetamines and cocaine. Blood cultures were obtained in the ED and the patient was started on IV fluid hydration. She remained hemodynamically stable and in no apparent distress, though difficult to arouse, and will be observed on telemetry unit for ongoing evaluation and management of acute encephalopathy.  Patient admitted with acute encephalopathy, psychosis thought to be related to drugs.   Assessment & Plan: Principal Problem:   Acute encephalopathy Active Problems:   Chronic hepatitis C without hepatic coma (HCC)  Generalized anxiety disorder   Substance abuse   Severe depressed bipolar I disorder (HCC)   Essential hypertension  Acute toxic metabolic encephalopathy: drug-related/amphetamine-induced psychosis. Patient has been afebrile, UDS positive for cocaine, amphetamine. B12 normal at 429, TSH normal at 0.550, RPR negative, HIV non-reactive. Ammonia normal at 24.  CT head no acute abnormalities, chronic lacunar infarct in basal ganglia. MRI negative for stroke, EEG with mild diffuse slowing.  - Neurology recommended psychiatry consultation, consider seroquel prn and supportive care. Signed off 8/7. - Will consult psychiatry for evaluation 8/9 given her persistent AMS. ?intentional overdose. - Treating UTI as below given possibly contributing to encephalopathy.  - NPO until alert and cooperative enough to definitively maintain airway. - Becoming more alert, pull foley catheter and discontinue restraints today. - Continue CIWA for 24 more hours.  E. coli UTI: Pan-sensitive.  - Deescalate abx to ancef (previously cipro and aztreonam), PCN allergy of hives noted. Plan total of 3 days therapy (today would be final dose since other abx were susceptible).  - Monitor blood cultures  Hypokalemia: Refractory.  - Replace via IV until taking po and check Mg.  Bipolar disorder: - Holding Sinequan, Lamictal, Celexa, and Elavil due to encephalopathy  Hypoxemia: Suspect hypoventilation due to AMS. CXRs have shown no infiltrate/aspiration. - Wean O2 as able.   HTN:  - IV hydralazine PRN.  - Holding lisinopril, normotensive.   Chronic hepatitis C Was seen by ID in April 2017 and started on 12-week course of Zepatier, not clear if she completed or not. No evidence for cirrhosis.  Hypoglycemia; IV fluids D 5.  - Convert to diet once able.  DVT prophylaxis: Lovenox Code Status: Full Family Communication: None at bedside this  AM Disposition Plan: Continue SDU level of care due to high nursing burden.    Consultants:   Neurology  Procedures:   EEG  Antimicrobials:  Ciprofloxacin > aztreonam > ancef   Subjective: Pt sleeping but rousable, not cooperative with history. Denied any complaints this morning. Was agitated overnight.  Objective: Vitals:   07/20/17 0720 07/20/17 0800 07/20/17 1000 07/20/17 1200  BP:  115/75 125/82 (!) 138/115  Pulse:  81 78 81  Resp:  17 17 (!) 24  Temp: 98.2 F (36.8 C)     TempSrc: Axillary     SpO2:  94% 94% 91%  Weight:      Height:        Intake/Output Summary (Last 24 hours) at 07/20/17 1301 Last data filed at 07/20/17 1200  Gross per 24 hour  Intake             2745 ml  Output             2150 ml  Net              595 ml   Filed Weights   07/18/17 0151  Weight: 68.3 kg (150 lb 9.2 oz)    Examination: General exam: 51 y.o. female in no distress  Respiratory system: Non-labored breathing. Clear to auscultation bilaterally.  Cardiovascular system: Regular rate and rhythm. No murmur, rub, or gallop. No JVD, and no pedal edema. Gastrointestinal system: Abdomen soft, non-tender, non-distended, with normoactive bowel sounds. No organomegaly or masses felt. Central nervous system: Lethargic, disoriented, not cooperative with exam but moves all extremities.  Extremities: Warm, no deformities, bilateral soft wrist restraints.  Skin: No rashes, lesions no ulcers on limited exam. Scattered remote tattoos.  Psychiatry: Judgement and insight appear abnormal.  Data Reviewed: I have personally reviewed following labs and imaging studies  CBC:  Recent Labs Lab 07/17/17 2220 07/18/17 0505 07/19/17 0731 07/20/17 0256  WBC 6.7 10.9* 5.9 7.1  NEUTROABS 5.5  --   --   --   HGB 14.0 13.3 12.5 12.0  HCT 40.2 39.0  40.2 36.8 35.0*  MCV 89.3 89.7 90.4 88.2  PLT 265 247 211 230   Basic Metabolic Panel:  Recent Labs Lab 07/17/17 2220 07/18/17 0505 07/19/17 0731 07/20/17 0256  NA 143 142 142 142  K 3.5 3.0* 3.2* 3.3*  CL 107 109  114* 112*  CO2 24 24 19* 23  GLUCOSE 118* 96 71 99  BUN 15 13 13 7   CREATININE 0.80 0.81 0.74 0.74  CALCIUM 9.3 8.8* 8.5* 8.4*   GFR: Estimated Creatinine Clearance: 77.2 mL/min (by C-G formula based on SCr of 0.74 mg/dL). Liver Function Tests:  Recent Labs Lab 07/17/17 2220 07/20/17 0256  AST 21 12*  ALT 10* 9*  ALKPHOS 62 53  BILITOT 1.0 0.4  PROT 7.3 6.0*  ALBUMIN 4.2 3.2*    Recent Labs Lab 07/17/17 2220  LIPASE 21    Recent Labs Lab 07/17/17 2222  AMMONIA 24   Coagulation Profile:  Recent Labs Lab 07/17/17 2220  INR 1.04   Cardiac Enzymes: No results for input(s): CKTOTAL, CKMB, CKMBINDEX, TROPONINI in the last 168 hours. BNP (last 3 results) No results for input(s): PROBNP in the last 8760 hours. HbA1C: No results for input(s): HGBA1C in the last 72 hours. CBG:  Recent Labs Lab 07/18/17 1012 07/19/17 0827 07/19/17 0940 07/19/17 1011 07/20/17 0733  GLUCAP 90 58* 67 136* 115*   Lipid Profile: No results for input(s): CHOL, HDL, LDLCALC, TRIG,  CHOLHDL, LDLDIRECT in the last 72 hours. Thyroid Function Tests:  Recent Labs  07/17/17 2350  TSH 0.550   Anemia Panel:  Recent Labs  07/17/17 2350  VITAMINB12 429   Urine analysis:    Component Value Date/Time   COLORURINE AMBER (A) 07/17/2017 2336   APPEARANCEUR HAZY (A) 07/17/2017 2336   LABSPEC 1.021 07/17/2017 2336   PHURINE 5.0 07/17/2017 2336   GLUCOSEU NEGATIVE 07/17/2017 2336   HGBUR SMALL (A) 07/17/2017 2336   BILIRUBINUR NEGATIVE 07/17/2017 2336   KETONESUR 5 (A) 07/17/2017 2336   PROTEINUR NEGATIVE 07/17/2017 2336   UROBILINOGEN 0.2 07/22/2015 1325   NITRITE NEGATIVE 07/17/2017 2336   LEUKOCYTESUR MODERATE (A) 07/17/2017 2336   Recent Results (from the past 240 hour(s))  Culture, blood (routine x 2)     Status: None (Preliminary result)   Collection Time: 07/17/17 10:47 PM  Result Value Ref Range Status   Specimen Description BLOOD LEFT ANTECUBITAL  Final   Special  Requests   Final    BOTTLES DRAWN AEROBIC AND ANAEROBIC Blood Culture adequate volume   Culture   Final    NO GROWTH 2 DAYS Performed at [facility], [address] [place], [place] [address]    Report Status PENDING  Incomplete  Culture, blood (routine x 2)     Status: None (Preliminary result)   Collection Time: 07/17/17 10:48 PM  Result Value Ref Range Status   Specimen Description BLOOD RIGHT ANTECUBITAL  Final   Special Requests   Final    BOTTLES DRAWN AEROBIC AND ANAEROBIC Blood Culture adequate volume   Culture   Final    NO GROWTH 2 DAYS Performed at [facility], [address] [place], [place] [address]    Report Status PENDING  Incomplete  Culture, Urine     Status: Abnormal   Collection Time: 07/17/17 11:36 PM  Result Value Ref Range Status   Specimen Description URINE, CATHETERIZED  Final   Special Requests NONE  Final   Culture >=100,000 COLONIES/mL ESCHERICHIA COLI (A)  Final   Report Status 07/20/2017 FINAL  Final   Organism ID, Bacteria ESCHERICHIA COLI (A)  Final      Susceptibility   Escherichia coli - MIC*    AMPICILLIN 8 SENSITIVE Sensitive     CEFAZOLIN <=4 SENSITIVE Sensitive     CEFTRIAXONE <=1 SENSITIVE Sensitive     CIPROFLOXACIN <=0.25 SENSITIVE Sensitive     GENTAMICIN <=1 SENSITIVE Sensitive     IMIPENEM <=0.25 SENSITIVE Sensitive     NITROFURANTOIN <=16 SENSITIVE Sensitive     TRIMETH/SULFA <=20 SENSITIVE Sensitive     AMPICILLIN/SULBACTAM <=2 SENSITIVE Sensitive     PIP/TAZO <=4 SENSITIVE Sensitive     Extended ESBL NEGATIVE Sensitive     * >=100,000 COLONIES/mL ESCHERICHIA COLI  MRSA PCR Screening     Status: None   Collection Time: 07/18/17 12:35 PM  Result Value Ref Range Status   MRSA by PCR NEGATIVE NEGATIVE Final    Comment:        The GeneXpert MRSA Assay (FDA approved for NASAL specimens only), is one component of a comprehensive MRSA colonization surveillance program. It is not intended to diagnose  MRSA infection nor to guide or monitor treatment for MRSA infections.       Radiology Studies: Mr Brain Wo Contrast  Result Date: 07/18/2017 CLINICAL DATA:  51 y/o  F; encephalopathy. EXAM: MRI HEAD WITHOUT CONTRAST TECHNIQUE: Multiplanar, multiecho pulse sequences of the brain and surrounding structures  were obtained without intravenous contrast. COMPARISON:  07/17/2017 CT head FINDINGS: Brain: No acute infarction, hemorrhage, hydrocephalus, extra-axial collection or mass lesion. Few nonspecific foci of T2 FLAIR hyperintense signal abnormality in subcortical and periventricular white matter is compatible with mild chronic microvascular ischemic changes for age. Mild brain parenchymal volume loss. T2 hyperintense foci and left basal ganglia without surrounding FLAIR signal abnormality likely represent prominent perivascular spaces. Vascular: Normal flow voids. Skull and upper cervical spine: Normal marrow signal. Sinuses/Orbits: Negative. Other: None. IMPRESSION: 1. No acute intracranial abnormality. 2. Mild chronic microvascular ischemic changes and mild parenchymal volume loss of the brain. Electronically Signed   By: Mitzi Hansen M.D.   On: 07/18/2017 16:01   Dg Chest Port 1 View  Result Date: 07/19/2017 CLINICAL DATA:  Substance abuse, hypertension, asthma, altered mental status EXAM: PORTABLE CHEST 1 VIEW COMPARISON:  Portable exam 0833 hours compared 07/17/2017 FINDINGS: Normal heart size, mediastinal contours, and pulmonary vascularity. Rotated to the RIGHT. Lungs clear. No infiltrate, pleural effusion, or pneumothorax. Osseous structures unremarkable. IMPRESSION: No acute abnormalities. Electronically Signed   By: Ulyses Southward M.D.   On: 07/19/2017 09:10    Scheduled Meds: . enoxaparin (LOVENOX) injection  40 mg Subcutaneous Q24H  . folic acid  1 mg Intravenous Daily  . mouth rinse  15 mL Mouth Rinse BID  . multivitamin with minerals  1 tablet Oral Daily  . pantoprazole  (PROTONIX) IV  40 mg Intravenous Q12H  . sodium chloride flush  3 mL Intravenous Q12H  . sodium chloride flush  3 mL Intravenous Q12H  . thiamine  100 mg Oral Daily   Or  . thiamine  100 mg Intravenous Daily   Continuous Infusions: . sodium chloride    .  ceFAZolin (ANCEF) IV    . dextrose 5 % and 0.9% NaCl 100 mL/hr at 07/20/17 1200     LOS: 2 days   Time spent: 25 minutes.  Hazeline Junker, MD [facility] Pager [phone]  If 7PM-7AM, please contact night-coverage [email or web address] Password [email or web address] 07/20/2017, 1:01 PM

## 2017-07-20 NOTE — Progress Notes (Signed)
Pharmacy Antibiotic Note  Kelly Kelley is a 51 y.o. female admitted on 07/17/2017 with UTI.  Pharmacy has been consulted for aztreonam dosing.  Patient has history of penicillin allergy that required hospitalization.  No history of cephalosporin use per chart review and MD would like to avoid Cipro due to potential of contributing to AMS.  Patient difficult to awake, MD wants empiric IV antibiotics for possible UTI.  Today, 07/20/2017 Day #3 Aztreonam Afebrile  WBC wnl SCr stable, CrCl 77 ml/min Rx consulted to change aztreonam to cefazolin  Plan: Cefazolin 1g IV q8h Monitor tolerance to cephalosporin therapy After patient's mental status improves, f/u for potential to adjust antibiotics to an oral regimen or discontinue antibiotics if patient was not having urinary symptoms PTA.  Height: 5\' 3"  (160 cm) Weight: 150 lb 9.2 oz (68.3 kg) IBW/kg (Calculated) : 52.4  Temp (24hrs), Avg:98.2 F (36.8 C), Min:97.7 F (36.5 C), Max:98.4 F (36.9 C)   Recent Labs Lab 07/17/17 2220 07/17/17 2250 07/18/17 0505 07/19/17 0731 07/20/17 0256  WBC 6.7  --  10.9* 5.9 7.1  CREATININE 0.80  --  0.81 0.74 0.74  LATICACIDVEN  --  0.76  --   --   --     Estimated Creatinine Clearance: 77.2 mL/min (by C-G formula based on SCr of 0.74 mg/dL).    Allergies  Allergen Reactions  . Morphine And Related Itching and Rash  . Penicillins Hives    Has patient had a PCN reaction causing immediate rash, facial/tongue/throat swelling, SOB or lightheadedness with hypotension: no Has patient had a PCN reaction causing severe rash involving mucus membranes or skin necrosis: no Has patient had a PCN reaction that required hospitalization : yes Has patient had a PCN reaction occurring within the last 10 years: no If all of the above answers are "NO", then may proceed with Cephalosporin use.     Antimicrobials this admission: 8/6 Cipro x 1 8/6 Aztreonam >> 8/8 8/8 Ancef >>   Dose adjustments this  admission:   Microbiology results: 8/5 BCx: ngtd 8/5 UCx: > 100 K E.coli (pansensitive) 8/5 RPR neg 8/5 HIV neg 8/5 MRSA PCR neg  Thank you for allowing pharmacy to be a part of this patient's care.  Loralee PacasErin Tawny Raspberry, PharmD, BCPS Pager: [phone] 07/20/2017 7:31 AM

## 2017-07-21 DIAGNOSIS — B1920 Unspecified viral hepatitis C without hepatic coma: Secondary | ICD-10-CM

## 2017-07-21 DIAGNOSIS — F1414 Cocaine abuse with cocaine-induced mood disorder: Secondary | ICD-10-CM

## 2017-07-21 DIAGNOSIS — R410 Disorientation, unspecified: Secondary | ICD-10-CM

## 2017-07-21 DIAGNOSIS — R443 Hallucinations, unspecified: Secondary | ICD-10-CM

## 2017-07-21 DIAGNOSIS — F17210 Nicotine dependence, cigarettes, uncomplicated: Secondary | ICD-10-CM

## 2017-07-21 DIAGNOSIS — R4182 Altered mental status, unspecified: Secondary | ICD-10-CM

## 2017-07-21 DIAGNOSIS — F1520 Other stimulant dependence, uncomplicated: Secondary | ICD-10-CM

## 2017-07-21 DIAGNOSIS — F29 Unspecified psychosis not due to a substance or known physiological condition: Secondary | ICD-10-CM

## 2017-07-21 LAB — MAGNESIUM: Magnesium: 1.8 mg/dL (ref 1.7–2.4)

## 2017-07-21 LAB — BASIC METABOLIC PANEL
ANION GAP: 6 (ref 5–15)
BUN: 12 mg/dL (ref 6–20)
CALCIUM: 8.1 mg/dL — AB (ref 8.9–10.3)
CO2: 24 mmol/L (ref 22–32)
CREATININE: 0.75 mg/dL (ref 0.44–1.00)
Chloride: 113 mmol/L — ABNORMAL HIGH (ref 101–111)
GFR calc Af Amer: >60 mL/min (ref >60)
GLUCOSE: 100 mg/dL — AB (ref 65–99)
Potassium: 3.7 mmol/L (ref 3.5–5.1)
Sodium: 143 mmol/L (ref 135–145)

## 2017-07-21 LAB — GLUCOSE, CAPILLARY: Glucose-Capillary: 95 mg/dL (ref 65–99)

## 2017-07-21 MED ORDER — LISINOPRIL 20 MG PO TABS
20.0000 mg | ORAL_TABLET | Freq: Every day | ORAL | Status: DC
  Administered 2017-07-21 – 2017-07-22 (×2): 20 mg via ORAL
  Filled 2017-07-21: qty 1
  Filled 2017-07-21: qty 2

## 2017-07-21 NOTE — Progress Notes (Signed)
PROGRESS NOTE  TENLEE WOLLIN  WUJ:[id number] DOB: 01/05/1966 DOA: 07/17/2017 PCP: Patient, No Pcp Per   Brief Narrative: Delsa Sale Robinsonis a 51 y.o.femalewith medical history significant for polysubstance abuse, bipolar disorder, anxiety, hypertension, and chronic hepatitis C, now presenting to the emergency department after her roommate was having difficulty waking her. Roommate reported speaking with the patient at approximately 4 AM on 07/17/2017 and reported that she was in her usual state at that time. He went off to work couple hours later, returning this afternoon to find her sound asleep, noting that it was unusual for her to have gone to bed so early. She continued to sleep and he was having difficulty waking her, he became concerned for a possible overdose, and EMS was called. Upon arrival to the ED, patient is found to be afebrile, saturating well on room air, hypertensive to 180/115, and with vitals otherwise stable. EKG features a sinus rhythm, chest x-ray is notable only for mild bibasilar atelectasis, and noncontrast head CT is negative for acute intracranial abnormality. Chemistry panels unremarkable, CBC is within normal limits, lactic acid is reassuring at 0.76, INR and troponin are normal, and ammonia level was also normal. Acetaminophen, salicylates, and ethanol levels were undetectable.   Urinalysis suggests a possible infection and UDS is positive for amphetamines and cocaine. Blood cultures were obtained in the ED and the patient was started on IV fluid hydration. She remained hemodynamically stable but has continued to demonstrate hallucinations and agitation requiring sedation and intermittent restraints.   Patient admitted with acute encephalopathy, psychosis thought to be related to drugs. Neurology has signed off. Patient's alertness during the day is improving, so psychiatry is consulted.   Assessment & Plan: Principal Problem:   Acute encephalopathy Active  Problems:   Chronic hepatitis C without hepatic coma (HCC)   Generalized anxiety disorder   Substance abuse   Severe depressed bipolar I disorder (HCC)   Essential hypertension  Acute toxic metabolic encephalopathy: drug-related/amphetamine-induced psychosis. Patient has been afebrile, UDS positive for cocaine, amphetamine. B12 normal at 429, TSH normal at 0.550, RPR negative, HIV non-reactive. Ammonia normal at 24.  CT head no acute abnormalities, chronic lacunar infarct in basal ganglia. MRI negative for stroke, EEG with mild diffuse slowing.  - Neurology recommended psychiatry consultation, consider seroquel prn and supportive care. Signed off 8/7. - Will consult psychiatry for evaluation 8/9 given her persistent AMS. ?intentional overdose, history of bipolar disorder.  - Treating UTI as below given possibly contributing to encephalopathy. - If not resolving, consider repeat UDS.  - Becoming more alert, discontinue restraints as able. - Continue CIWA  E. coli UTI: Pan-sensitive. Completed 3 days Tx on 8/8. PCN allergy of hives noted, tolerated ancef.  - Monitor blood cultures  Hypokalemia: Refractory.  - Replace via IV until taking po and check Mg.  Bipolar disorder: - Holding Sinequan, Lamictal, Celexa, and Elavil due to encephalopathy, pending psychiatry evaluation.  Hypoxemia: Suspect hypoventilation due to AMS. CXRs have shown no infiltrate/aspiration. - Wean O2 as able.   HTN:  - IV hydralazine PRN.  - Restart lisinopril as BPs are trending upward.  Chronic hepatitis C Was seen by ID in April 2017 and started on 12-week course of Zepatier, not clear if she completed or not. No evidence for cirrhosis.  Hypoglycemia: Resolved - Continue diet  DVT prophylaxis: Lovenox Code Status: Full Family Communication: None at bedside this AM Disposition Plan: Transfer to floor, psychiatry consult  Consultants:   Neurology  Psychiatry  Procedures:  EEG  Antimicrobials:  Ciprofloxacin > aztreonam > ancef from 8/6 > 8/8  Subjective: Pt sleeping but rousable, not willing to participate in history this morning. She denies complaints and doesnot recall agitation overnight, though this was reported by staff and required ativan prn. Per report, yesterday she was conversant with family members, apologizing for "putting them through this."   Objective: BP (!) 177/126   Pulse 70   Temp (!) 96.5 F (35.8 C) (Axillary)   Resp 20   Ht 5\' 3"  (1.6 m)   Wt 72.8 kg (160 lb 7.9 oz)   SpO2 96%   BMI 28.43 kg/m   General exam: 51 y.o. female sleeping on right side in no distress  Respiratory system: Non-labored breathing. Clear to auscultation bilaterally.  Cardiovascular system: Regular rate and rhythm. No murmur, rub, or gallop. No JVD, and no pedal edema. Gastrointestinal system: Abdomen soft, non-tender, non-distended, with normoactive bowel sounds. No organomegaly or masses felt. Central nervous system: Drowsy, not cooperative with exam but moves all extremities.  Extremities: Warm, no deformities, bilateral soft wrist restraints.  Skin: No rashes, lesions no ulcers on limited exam. Scattered remote tattoos.  Psychiatry: Judgement and insight appear abnormal.  Data Reviewed: I have personally reviewed following labs and imaging studies  CBC:  Recent Labs Lab 07/17/17 2220 07/18/17 0505 07/19/17 0731 07/20/17 0256  WBC 6.7 10.9* 5.9 7.1  NEUTROABS 5.5  --   --   --   HGB 14.0 13.3 12.5 12.0  HCT 40.2 39.0  40.2 36.8 35.0*  MCV 89.3 89.7 90.4 88.2  PLT 265 247 211 230   Basic Metabolic Panel:  Recent Labs Lab 07/17/17 2220 07/18/17 0505 07/19/17 0731 07/20/17 0256 07/21/17 0253  NA 143 142 142 142 143  K 3.5 3.0* 3.2* 3.3* 3.7  CL 107 109 114* 112* 113*  CO2 24 24 19* 23 24  GLUCOSE 118* 96 71 99 100*  BUN 15 13 13 7 12   CREATININE 0.80 0.81 0.74 0.74 0.75  CALCIUM 9.3 8.8* 8.5* 8.4* 8.1*  MG  --   --   --    --  1.8   GFR: Estimated Creatinine Clearance: 79.6 mL/min (by C-G formula based on SCr of 0.75 mg/dL). Liver Function Tests:  Recent Labs Lab 07/17/17 2220 07/20/17 0256  AST 21 12*  ALT 10* 9*  ALKPHOS 62 53  BILITOT 1.0 0.4  PROT 7.3 6.0*  ALBUMIN 4.2 3.2*    Recent Labs Lab 07/17/17 2220  LIPASE 21    Recent Labs Lab 07/17/17 2222  AMMONIA 24   Coagulation Profile:  Recent Labs Lab 07/17/17 2220  INR 1.04   Urine analysis:    Component Value Date/Time   COLORURINE AMBER (A) 07/17/2017 2336   APPEARANCEUR HAZY (A) 07/17/2017 2336   LABSPEC 1.021 07/17/2017 2336   PHURINE 5.0 07/17/2017 2336   GLUCOSEU NEGATIVE 07/17/2017 2336   HGBUR SMALL (A) 07/17/2017 2336   BILIRUBINUR NEGATIVE 07/17/2017 2336   KETONESUR 5 (A) 07/17/2017 2336   PROTEINUR NEGATIVE 07/17/2017 2336   UROBILINOGEN 0.2 07/22/2015 1325   NITRITE NEGATIVE 07/17/2017 2336   LEUKOCYTESUR MODERATE (A) 07/17/2017 2336   Recent Results (from the past 240 hour(s))  Culture, blood (routine x 2)     Status: None (Preliminary result)   Collection Time: 07/17/17 10:47 PM  Result Value Ref Range Status   Specimen Description BLOOD LEFT ANTECUBITAL  Final   Special Requests   Final    BOTTLES DRAWN AEROBIC AND ANAEROBIC  Blood Culture adequate volume   Culture   Final    NO GROWTH 2 DAYS Performed at [facility], [address] [place], [place] [address]    Report Status PENDING  Incomplete  Culture, blood (routine x 2)     Status: None (Preliminary result)   Collection Time: 07/17/17 10:48 PM  Result Value Ref Range Status   Specimen Description BLOOD RIGHT ANTECUBITAL  Final   Special Requests   Final    BOTTLES DRAWN AEROBIC AND ANAEROBIC Blood Culture adequate volume   Culture   Final    NO GROWTH 2 DAYS Performed at [facility], [address], [place], [place] [address]    Report Status PENDING  Incomplete  Culture, Urine     Status: Abnormal   Collection Time:  07/17/17 11:36 PM  Result Value Ref Range Status   Specimen Description URINE, CATHETERIZED  Final   Special Requests NONE  Final   Culture >=100,000 COLONIES/mL ESCHERICHIA COLI (A)  Final   Report Status 07/20/2017 FINAL  Final   Organism ID, Bacteria ESCHERICHIA COLI (A)  Final      Susceptibility   Escherichia coli - MIC*    AMPICILLIN 8 SENSITIVE Sensitive     CEFAZOLIN <=4 SENSITIVE Sensitive     CEFTRIAXONE <=1 SENSITIVE Sensitive     CIPROFLOXACIN <=0.25 SENSITIVE Sensitive     GENTAMICIN <=1 SENSITIVE Sensitive     IMIPENEM <=0.25 SENSITIVE Sensitive     NITROFURANTOIN <=16 SENSITIVE Sensitive     TRIMETH/SULFA <=20 SENSITIVE Sensitive     AMPICILLIN/SULBACTAM <=2 SENSITIVE Sensitive     PIP/TAZO <=4 SENSITIVE Sensitive     Extended ESBL NEGATIVE Sensitive     * >=100,000 COLONIES/mL ESCHERICHIA COLI  MRSA PCR Screening     Status: None   Collection Time: 07/18/17 12:35 PM  Result Value Ref Range Status   MRSA by PCR NEGATIVE NEGATIVE Final    Comment:        The GeneXpert MRSA Assay (FDA approved for NASAL specimens only), is one component of a comprehensive MRSA colonization surveillance program. It is not intended to diagnose MRSA infection nor to guide or monitor treatment for MRSA infections.       Radiology Studies: Dg Chest Port 1 View  Result Date: 07/19/2017 CLINICAL DATA:  Substance abuse, hypertension, asthma, altered mental status EXAM: PORTABLE CHEST 1 VIEW COMPARISON:  Portable exam 0833 hours compared 07/17/2017 FINDINGS: Normal heart size, mediastinal contours, and pulmonary vascularity. Rotated to the RIGHT. Lungs clear. No infiltrate, pleural effusion, or pneumothorax. Osseous structures unremarkable. IMPRESSION: No acute abnormalities. Electronically Signed   By: Ulyses Southward M.D.   On: 07/19/2017 09:10    Scheduled Meds: . enoxaparin (LOVENOX) injection  40 mg Subcutaneous Q24H  . folic acid  1 mg Intravenous Daily  . mouth rinse  15 mL Mouth  Rinse BID  . multivitamin with minerals  1 tablet Oral Daily  . pantoprazole (PROTONIX) IV  40 mg Intravenous Q12H  . sodium chloride flush  3 mL Intravenous Q12H  . sodium chloride flush  3 mL Intravenous Q12H  . thiamine  100 mg Intravenous Daily   Continuous Infusions: . sodium chloride    . dextrose 5 % and 0.9% NaCl 100 mL/hr at 07/21/17 0600     LOS: 3 days   Time spent: 25 minutes.  Hazeline Junker, MD [facility] Pager [phone]  If 7PM-7AM, please contact night-coverage [email or web address] Password [facility] 07/21/2017, 8:33 AM

## 2017-07-21 NOTE — Consult Note (Signed)
[place] Psychiatry Consult   Reason for Consult:  Psychosis vs AMS Referring Physician:  Dr. Bonner Puna Patient Identification: Kelly Kelley MRN:  [id number] Principal Diagnosis: Acute encephalopathy Diagnosis:   Patient Active Problem List   Diagnosis Date Noted  . Altered mental status [R41.82]   . Acute encephalopathy [G93.40] 07/18/2017  . Chronic hepatitis C without hepatic coma ([place]) [B18.2] 03/31/2016  . Generalized anxiety disorder [F41.1] 03/31/2016  . Substance abuse [F19.10] 03/31/2016  . Insomnia [G47.00] 03/31/2016  . GERD (gastroesophageal reflux disease) [K21.9] 03/31/2016  . Severe depressed bipolar I disorder ([place]) [F31.4] 03/31/2016  . Essential hypertension [I10] 03/31/2016    Total Time spent with patient: 1 hour  Subjective:   Kelly Kelley is a 51 y.o. female patient admitted with Altered mental status.  HPI: Kelly Benner Kelly Kelley medical history significant for polysubstance abuse, bipolar disorder, anxiety, hypertension, and chronic hepatitis C, now presenting to the emergency department after her roommate was having difficulty waking her.   Psychiatric consultation was called in for possible psychosis. Patient seen with the LCSW, chart reviewed and case discussed with the hospitalist at this face-to-face psychiatric consultation and evaluation of possible psychosis. Patient is calm and cooperative appeared lying in bed mostly closed her eyes during this assessment. Patient stated she needed to go to the restroom and trying to hold on by closing her eyes. Even though patient has hallucinations and agitation at the presentation of the emergency department probably secondary to cocaine and amphetamines and currently she has no evidence of agitation or psychosis.   Patient denied symptoms of depression, anxiety, PTSD, auditory/visual hallucinations, delusions and paranoia. Patient also denied suicidal/homicidal ideation, intention or  plans. Patient denied intentional drug overdose. Patient endorses polysubstance abuse and recent relapse with cocaine and amphetamine. Patient could not recall exact time she was relapsing drug of abuse. Patient workup for medical conditions has been negative so far. Urinalysis suggests a possible infection and UDS is positive for amphetamines and cocaine. Reportedly patient demonstrate hallucinations and agitation requiring sedation and intermittent restraints while in the emergency department.   Past Psychiatric History: Patient has no history of acute psychiatric hospitalizations at behavioral health center and reportedly receiving outpatient substance abuse treatment program from [facility].   Risk to Self: Is patient at risk for suicide?: No Risk to Others:   Prior Inpatient Therapy:   Prior Outpatient Therapy:    Past Medical History:  Past Medical History:  Diagnosis Date  . Anxiety   . Asthma   . Bipolar disorder ([place])   . Borderline personality disorder   . Essential hypertension 03/31/2016  . Insomnia   . PTSD (post-traumatic stress disorder)   . Substance abuse     Past Surgical History:  Procedure Laterality Date  . CESAREAN SECTION    . TUBAL LIGATION     Family History:  Family History  Problem Relation Age of Onset  . Hypertension Mother    Family Psychiatric  History: Denied family history of mental illness. Social History:  History  Alcohol Use  . 0.0 oz/week    Comment: in recovery     History  Drug Use  . Types: Cocaine    Comment: in recovery    Social History   Social History  . Marital status: Divorced    Spouse name: N/A  . Number of children: N/A  . Years of education: N/A   Social History Main Topics  . Smoking status: Current Every Day Smoker  Packs/day: 1.00  . Smokeless tobacco: Never Used  . Alcohol use 0.0 oz/week     Comment: in recovery  . Drug use: Yes    Types: Cocaine     Comment: in recovery  . Sexual activity: Not Currently    Other Topics Concern  . None   Social History Narrative  . None   Additional Social History:    Allergies:   Allergies  Allergen Reactions  . Morphine And Related Itching and Rash  . Penicillins Hives    Has patient had a PCN reaction causing immediate rash, facial/tongue/throat swelling, SOB or lightheadedness with hypotension: no Has patient had a PCN reaction causing severe rash involving mucus membranes or skin necrosis: no Has patient had a PCN reaction that required hospitalization : yes Has patient had a PCN reaction occurring within the last 10 years: no If all of the above answers are "NO", then may proceed with Cephalosporin use.     Labs:  Results for orders placed or performed during the hospital encounter of 07/17/17 (from the past 48 hour(s))  CBC     Status: Abnormal   Collection Time: 07/20/17  2:56 AM  Result Value Ref Range   WBC 7.1 4.0 - 10.5 K/uL   RBC 3.97 3.87 - 5.11 MIL/uL   Hemoglobin 12.0 12.0 - 15.0 g/dL   HCT 35.0 (L) 36.0 - 46.0 %   MCV 88.2 78.0 - 100.0 fL   MCH 30.2 26.0 - 34.0 pg   MCHC 34.3 30.0 - 36.0 g/dL   RDW 12.9 11.5 - 15.5 %   Platelets 230 150 - 400 K/uL  Comprehensive metabolic panel     Status: Abnormal   Collection Time: 07/20/17  2:56 AM  Result Value Ref Range   Sodium 142 135 - 145 mmol/L   Potassium 3.3 (L) 3.5 - 5.1 mmol/L   Chloride 112 (H) 101 - 111 mmol/L   CO2 23 22 - 32 mmol/L   Glucose, Bld 99 65 - 99 mg/dL   BUN 7 6 - 20 mg/dL   Creatinine, Ser 0.74 0.44 - 1.00 mg/dL   Calcium 8.4 (L) 8.9 - 10.3 mg/dL   Total Protein 6.0 (L) 6.5 - 8.1 g/dL   Albumin 3.2 (L) 3.5 - 5.0 g/dL   AST 12 (L) 15 - 41 U/L   ALT 9 (L) 14 - 54 U/L   Alkaline Phosphatase 53 38 - 126 U/L   Total Bilirubin 0.4 0.3 - 1.2 mg/dL   GFR calc non Af Amer >60 >60 mL/min   GFR calc Af Amer >60 >60 mL/min    Comment: (NOTE) The eGFR has been calculated using the CKD EPI equation. This calculation has not been validated in all clinical  situations. eGFR's persistently <60 mL/min signify possible Chronic Kidney Disease.    Anion gap 7 5 - 15  Glucose, capillary     Status: Abnormal   Collection Time: 07/20/17  7:33 AM  Result Value Ref Range   Glucose-Capillary 115 (H) 65 - 99 mg/dL   Comment 1 Notify RN    Comment 2 Document in Chart   Basic metabolic panel     Status: Abnormal   Collection Time: 07/21/17  2:53 AM  Result Value Ref Range   Sodium 143 135 - 145 mmol/L   Potassium 3.7 3.5 - 5.1 mmol/L   Chloride 113 (H) 101 - 111 mmol/L   CO2 24 22 - 32 mmol/L   Glucose, Bld 100 (H) 65 - 99  mg/dL   BUN 12 6 - 20 mg/dL   Creatinine, Ser 0.75 0.44 - 1.00 mg/dL   Calcium 8.1 (L) 8.9 - 10.3 mg/dL   GFR calc non Af Amer >60 >60 mL/min   GFR calc Af Amer >60 >60 mL/min    Comment: (NOTE) The eGFR has been calculated using the CKD EPI equation. This calculation has not been validated in all clinical situations. eGFR's persistently <60 mL/min signify possible Chronic Kidney Disease.    Anion gap 6 5 - 15  Magnesium     Status: None   Collection Time: 07/21/17  2:53 AM  Result Value Ref Range   Magnesium 1.8 1.7 - 2.4 mg/dL  Glucose, capillary     Status: None   Collection Time: 07/21/17  8:03 AM  Result Value Ref Range   Glucose-Capillary 95 65 - 99 mg/dL    Current Facility-Administered Medications  Medication Dose Route Frequency Provider Last Rate Last Dose  . 0.9 %  sodium chloride infusion  250 mL Intravenous PRN Opyd, Ilene Qua, MD      . acetaminophen (TYLENOL) tablet 650 mg  650 mg Oral Q6H PRN Opyd, Ilene Qua, MD       Or  . acetaminophen (TYLENOL) suppository 650 mg  650 mg Rectal Q6H PRN Opyd, Ilene Qua, MD      . bisacodyl (DULCOLAX) EC tablet 5 mg  5 mg Oral Daily PRN Opyd, Ilene Qua, MD      . enoxaparin (LOVENOX) injection 40 mg  40 mg Subcutaneous Q24H Opyd, Ilene Qua, MD   40 mg at 00/93/81 8299  . folic acid injection 1 mg  1 mg Intravenous Daily Rise Patience, MD   1 mg at 07/21/17  1028  . hydrALAZINE (APRESOLINE) injection 10 mg  10 mg Intravenous Q6H PRN Regalado, Belkys A, MD   10 mg at 07/21/17 1043  . lisinopril (PRINIVIL,ZESTRIL) tablet 20 mg  20 mg Oral Daily Patrecia Pour, MD   20 mg at 07/21/17 1027  . LORazepam (ATIVAN) injection 2-3 mg  2-3 mg Intravenous Q1H PRN Rise Patience, MD   2 mg at 07/20/17 2315  . MEDLINE mouth rinse  15 mL Mouth Rinse BID Regalado, Belkys A, MD   15 mL at 07/21/17 1000  . multivitamin with minerals tablet 1 tablet  1 tablet Oral Daily Regalado, Belkys A, MD   1 tablet at 07/21/17 1027  . ondansetron (ZOFRAN) tablet 4 mg  4 mg Oral Q6H PRN Opyd, Ilene Qua, MD       Or  . ondansetron (ZOFRAN) injection 4 mg  4 mg Intravenous Q6H PRN Opyd, Ilene Qua, MD      . pantoprazole (PROTONIX) injection 40 mg  40 mg Intravenous Q12H Regalado, Belkys A, MD   40 mg at 07/21/17 1027  . senna-docusate (Senokot-S) tablet 1 tablet  1 tablet Oral QHS PRN Opyd, Ilene Qua, MD      . sodium chloride flush (NS) 0.9 % injection 3 mL  3 mL Intravenous Q12H Opyd, Ilene Qua, MD   3 mL at 07/21/17 1000  . sodium chloride flush (NS) 0.9 % injection 3 mL  3 mL Intravenous Q12H Opyd, Ilene Qua, MD   3 mL at 07/21/17 1000  . sodium chloride flush (NS) 0.9 % injection 3 mL  3 mL Intravenous PRN Opyd, Ilene Qua, MD      . thiamine (B-1) injection 100 mg  100 mg Intravenous Daily Rise Patience, MD  100 mg at 07/21/17 1027    Musculoskeletal: Strength & Muscle Tone: decreased Gait & Station: unable to stand Patient leans: N/A  Psychiatric Specialty Exam: Physical Exam as per history and physical   ROS denied nausea, vomiting, abdominal pain, shortness of breath and chest pain.  No Fever-chills, No Headache, No changes with Vision or hearing, reports vertigo No problems swallowing food or Liquids, No Chest pain, Cough or Shortness of Breath, No Abdominal pain, No Nausea or Vommitting, Bowel movements are regular, No Blood in stool or Urine, No  dysuria, No new skin rashes or bruises, No new joints pains-aches,  No new weakness, tingling, numbness in any extremity, No recent weight gain or loss, No polyuria, polydypsia or polyphagia,  A full 10 point Review of Systems was done, except as stated above, all other Review of Systems were negative.  Blood pressure (!) 151/96, pulse 94, temperature 98 F (36.7 C), temperature source Oral, resp. rate 20, height '5\' 3"'$  (1.6 m), weight 72.8 kg (160 lb 7.9 oz), SpO2 99 %.Body mass index is 28.43 kg/m.  General Appearance: Guarded  Eye Contact:  Fair  Speech:  Clear and Coherent and Slow  Volume:  Decreased  Mood:  Depressed  Affect:  Constricted and Depressed  Thought Process:  Coherent and Goal Directed  Orientation:  Full (Time, Place, and Person)  Thought Content:  Logical  Suicidal Thoughts:  No  Homicidal Thoughts:  No  Memory:  Immediate;   Fair Recent;   Fair Remote;   Fair  Judgement:  Impaired  Insight:  Fair  Psychomotor Activity:  Decreased  Concentration:  Concentration: Fair and Attention Span: Fair  Recall:  [employer] of Knowledge:  Good  Language:  Good  Akathisia:  Negative  Handed:  Right  AIMS (if indicated):     Assets:  Communication Skills Desire for Improvement Financial Resources/Insurance Housing Intimacy Leisure Time Resilience Social Support Transportation  ADL's:  Intact  Cognition:  WNL  Sleep:        Treatment Plan Summary: This is a 51 years old female presented with altered mental status, hallucinations and agitation secondary to psychostimulant abuse, urine drug screen is positive for cocaine and amphetamines. Patient has been receiving outpatient medication management from [facility].   Drug-induced psychosis, slowly resolving Polysubstance abuse versus dependence  Recommendation: Patient has no safety concerns. Patient denies current suicidal/homicidal ideation, intention or plans. Patient has no evidence of psychosis. Patient has no  withdrawal symptoms of drug of abuse Recommended no psychotropic medication management  Recommended refer to [facility] for substance abuse counseling services and medication management. Case discussed with a CSW who referred the patient to [facility] and medically stable.   Disposition: Patient does not meet criteria for psychiatric inpatient admission. Supportive therapy provided about ongoing stressors.  Ambrose Finland, MD 07/21/2017 12:46 PM

## 2017-07-21 NOTE — Clinical Social Work Note (Signed)
Clinical Social Work Assessment  Patient Details  Name: Kelly Kelley MRN: [id number] Date of Birth: 1966/09/19  Date of referral:  07/21/17               Reason for consult:  Substance Use/ETOH Abuse                Permission sought to share information with:  Family Supports Permission granted to share information::  Yes, Verbal Permission Granted  Name::        Agency::  [facility]   Relationship::     Contact Information:     Housing/Transportation Living arrangements for the past 2 months:  Single Family Home Source of Information:  Patient Patient Interpreter Needed:  None Criminal Activity/Legal Involvement Pertinent to Current Situation/Hospitalization:  No - Comment as needed Significant Relationships:  Friend Lives with:  Friends  Do you feel safe going back to the place where you live?  Yes Need for family participation in patient care:  Yes (Comment)  Care giving concerns:  Substance abuse and mental health concerns.   Social Worker assessment / plan:  CSW met with patient at bedside, explain role and reason for visit- to assist with residential vs. Outpatient resources.  Patient alert, oriented and agreeable to talk with CSW. Patient reports using illicit drugs and drinking alcohol. The last time was Monday night. Patient reports she has been drinking alcohol on and off for years. Patient reports she went to residential treatment at [facility] last year and was following [facility] with [facility] but has not recently. Patient reports she saw Dr. Oswaldo Done who prescribes her medications.  Patient reports she lives with a friend and works. She is unsure if she still has a job at this time. Patient reports she is agreeable to complete residential treatment again.   Plan: Follow up with  [place]. Patient prefers appointment for Monday 07/25/2017   Employment status:    Insurance information:  Self Pay (Medicaid Pending) PT  Recommendations:  Not assessed at this time (Pending ) Information / Referral to community resources:  Residential Substance Abuse Treatment Options, Outpatient Substance Abuse Treatment Options  Patient/Family's Response to care:  Agreeable and Responding well to care.   Patient/Family's Understanding of and Emotional Response to Diagnosis, Current Treatment, and Prognosis:  Patient understands illicit drugs are affecting her health. She reports, she is hopeful about residential treatment. She has tried to quit in the past but has not been successful.   Emotional Assessment Appearance:  Developmentally appropriate Attitude/Demeanor/Rapport:    Affect (typically observed):  Calm Orientation:  Oriented to Self, Oriented to Place, Oriented to  Time, Oriented to Situation Alcohol / Substance use:    Psych involvement (Current and /or in the community):  Yes (Comment) (Evaluation, pt. denies, HI/SI ideations)  Discharge Needs  Concerns to be addressed:  Discharge Planning Concerns, Care Coordination Readmission within the last 30 days:  No Current discharge risk:  Substance Abuse Barriers to Discharge:  Continued Medical Work up   [employer], [place] 07/21/2017, 4:10 PM

## 2017-07-21 NOTE — Progress Notes (Signed)
Wasted 1 mg Versed on 07/19/2017 with [place] OuchStephanie Russell RN. Unable to put in pyxis because S. Perlie GoldRussell RN is not scheduled today.

## 2017-07-22 DIAGNOSIS — F411 Generalized anxiety disorder: Secondary | ICD-10-CM

## 2017-07-22 LAB — BASIC METABOLIC PANEL
Anion gap: 8 (ref 5–15)
BUN: 10 mg/dL (ref 6–20)
CHLORIDE: 109 mmol/L (ref 101–111)
CO2: 24 mmol/L (ref 22–32)
CREATININE: 0.74 mg/dL (ref 0.44–1.00)
Calcium: 8.3 mg/dL — ABNORMAL LOW (ref 8.9–10.3)
GFR calc non Af Amer: >60 mL/min (ref >60)
Glucose, Bld: 116 mg/dL — ABNORMAL HIGH (ref 65–99)
POTASSIUM: 3.1 mmol/L — AB (ref 3.5–5.1)
Sodium: 141 mmol/L (ref 135–145)

## 2017-07-22 LAB — GLUCOSE, CAPILLARY: Glucose-Capillary: 129 mg/dL — ABNORMAL HIGH (ref 65–99)

## 2017-07-22 MED ORDER — FOLIC ACID 1 MG PO TABS
1.0000 mg | ORAL_TABLET | Freq: Every day | ORAL | Status: DC
  Administered 2017-07-22: 1 mg via ORAL
  Filled 2017-07-22: qty 1

## 2017-07-22 MED ORDER — POTASSIUM CHLORIDE CRYS ER 20 MEQ PO TBCR
40.0000 meq | EXTENDED_RELEASE_TABLET | Freq: Once | ORAL | Status: AC
  Administered 2017-07-22: 40 meq via ORAL
  Filled 2017-07-22: qty 2

## 2017-07-22 MED ORDER — VITAMIN B-1 100 MG PO TABS
100.0000 mg | ORAL_TABLET | Freq: Every day | ORAL | Status: DC
  Administered 2017-07-22: 100 mg via ORAL
  Filled 2017-07-22: qty 1

## 2017-07-22 MED ORDER — PANTOPRAZOLE SODIUM 40 MG PO TBEC
40.0000 mg | DELAYED_RELEASE_TABLET | Freq: Two times a day (BID) | ORAL | Status: DC
  Administered 2017-07-22: 40 mg via ORAL
  Filled 2017-07-22: qty 1

## 2017-07-22 NOTE — Progress Notes (Signed)
CSW set appointment for Tuesday, July 26, 2017 at 7[facility]. Patient agreeable and aware. Information on Provider follow up list.   Vivi BarrackNicole Iyani Dresner, Theresia MajorsLCSWA, MSW Clinical Social Worker 5E and Psychiatric Service Line [phone] 07/22/2017  10:52 AM

## 2017-07-22 NOTE — Progress Notes (Signed)
Patient and family member given discharge instructions and reminder to follow-up with [facility] next week.  Patient verbalized understanding of discharge instructions via teachback method.  Patient received all medications she had stored in pharmacy (signed copy in chart).

## 2017-07-22 NOTE — Discharge Summary (Signed)
Physician Discharge Summary  Kelly Kelley UJW:[id number] DOB: [birth year] DOA: 07/17/2017  PCP: Patient, No Pcp Per  Admit date: 07/17/2017 Discharge date: 07/22/2017  Admitted From: Home Disposition: Home   Recommendations for Outpatient Follow-up:  1. Follow up with psychiatry as soon as possible 2. Follow up with [facility] recovery services for rehabilitation as scheduled below 3. Monitor BMP and BP on lisinopril.  Home Health: None Equipment/Devices: None Discharge Condition: Stable CODE STATUS: Full Diet recommendation: Regular  Brief/Interim Summary: Kelly Paradise Robinsonis a 51 y.o.femalewith medical history significant for polysubstance abuse, bipolar disorder, anxiety, hypertension, and chronic hepatitis C, now presenting to the emergency department after her roommate was having difficulty waking her. Roommate reported speaking with the patient at approximately 4 AM on 07/17/2017 and reported that she was in her usual state at that time. He went off to work couple hours later, returning this afternoon to find her sound asleep, noting that it was unusual for her to have gone to bed so early. She continued to sleep and he was having difficulty waking her, he became concerned for a possible overdose, and EMS was called. Upon arrival to the ED, patient is found to be afebrile, saturating well on room air, hypertensive to 180/115, and with vitals otherwise stable. EKG features a sinus rhythm, chest x-ray is notable only for mild bibasilar atelectasis, and noncontrast head CT is negative for acute intracranial abnormality. Chemistry panels unremarkable, CBC is within normal limits, lactic acid is reassuring at 0.76, INR and troponin are normal, and ammonia level was also normal. Acetaminophen, salicylates, and ethanol levels were undetectable.   Urinalysis suggests a possible infection and UDS is positive for amphetamines and cocaine. Blood cultures were obtained in the ED and the patient was  started on IV fluid hydration. She remained hemodynamically stable but has continued to demonstrate hallucinations and agitation requiring sedation and intermittent restraints.   Patient admitted with acute encephalopathy, psychosis thought to be related to drugs. Neurology has signed off. Patient's alertness during the day was improving, so psychiatry was consulted and recommended outpatient followup and appointment with [facility] recovery services was made 8/13.  Discharge Diagnoses:  Principal Problem:   Acute encephalopathy Active Problems:   Chronic hepatitis C without hepatic coma (HCC)   Generalized anxiety disorder   Substance abuse   Severe depressed bipolar I disorder (HCC)   Essential hypertension  Acute toxic metabolic encephalopathy: drug-related/amphetamine-induced psychosis. Patient has been afebrile, UDS positive for cocaine, amphetamine. B12 normal at 429, TSH normal at 0.550, RPR negative, HIV non-reactive. Ammonia normal at 24.  CT head no acute abnormalities, chronic lacunar infarct in basal ganglia. MRI negative for stroke, EEG with mild diffuse slowing.  - Neurology recommended psychiatry consultation, consider seroquel prn and supportive care. Signed off 8/7. - Psychiatry consulted, stated no active safety concerns and no psychosis, therefore will be discharged in stable condition with resolved encephalopathy.  - Appointment made at [facility] on 8/13 prior to discharge.   E. coli UTI: Pan-sensitive. Completed 3 days Tx on 8/8. PCN allergy of hives noted, tolerated ancef.  - Monitor blood cultures (NGTD at 4 days)  Hypokalemia: Improved  Bipolar disorder: - Held home medications due to AMS, psychiatry made no recommendations regarding changes to these medications. Also could not confirm home medications with the patient.  - Instructed to follow up with psychiatry in the next week to discuss home medications.   Hypoxemia: Suspect hypoventilation due to AMS. CXRs have  shown no infiltrate/aspiration. - Resolved  HTN:  -  Continue lisinopril - Patient had lasix on medication list for unclear reasons. Pt not able to recall indication, so this was stopped. Appears euvolemic.   Chronic hepatitis C Was seen by ID in April 2017 and started on 12-week course of Zepatier, not clear if she completed or not. No evidence for cirrhosis.  Hypoglycemia: Resolved  Discharge Instructions Discharge Instructions    Discharge instructions    Complete by:  As directed    It is important that you stay away from all drugs and go to [facility] recovery as we've scheduled it for you prior to discharge.   Follow up with your psychiatrist to discuss which medications you should be on.     Allergies as of 07/22/2017      Reactions   Morphine And Related Itching, Rash   Penicillins Hives   Has patient had a PCN reaction causing immediate rash, facial/tongue/throat swelling, SOB or lightheadedness with hypotension: no Has patient had a PCN reaction causing severe rash involving mucus membranes or skin necrosis: no Has patient had a PCN reaction that required hospitalization : yes Has patient had a PCN reaction occurring within the last 10 years: no If all of the above answers are "NO", then may proceed with Cephalosporin use.      Medication List    STOP taking these medications   amitriptyline 50 MG tablet Commonly known as:  ELAVIL   citalopram 20 MG tablet Commonly known as:  CELEXA   doxepin 25 MG capsule Commonly known as:  SINEQUAN   Elbasvir-Grazoprevir 50-100 MG Tabs Commonly known as:  ZEPATIER   furosemide 20 MG tablet Commonly known as:  LASIX   hydrOXYzine 50 MG tablet Commonly known as:  ATARAX/VISTARIL   lamoTRIgine 25 MG tablet Commonly known as:  LAMICTAL   meloxicam 15 MG tablet Commonly known as:  MOBIC   QUEtiapine 300 MG 24 hr tablet Commonly known as:  SEROQUEL XR     TAKE these medications   albuterol 108 (90 Base) MCG/ACT  inhaler Commonly known as:  PROVENTIL HFA;VENTOLIN HFA Inhale 2 puffs into the lungs every 6 (six) hours as needed for wheezing or shortness of breath.   lisinopril 20 MG tablet Commonly known as:  PRINIVIL,ZESTRIL Take 20 mg by mouth daily.   multivitamin with minerals tablet Take 1 tablet by mouth daily.   omeprazole 20 MG capsule Commonly known as:  PRILOSEC Take 20 mg by mouth daily.      Follow-up Information    Services, [facility]. Go on 07/26/2017.   Why:  Please go to appointment: 7:45am.  Take 30 day supply of medications and clothing.  There is No Smoking at the Facility (Please bring Patch) Contact information: [address] [place] [address] [phone]          Allergies  Allergen Reactions  . Morphine And Related Itching and Rash  . Penicillins Hives    Has patient had a PCN reaction causing immediate rash, facial/tongue/throat swelling, SOB or lightheadedness with hypotension: no Has patient had a PCN reaction causing severe rash involving mucus membranes or skin necrosis: no Has patient had a PCN reaction that required hospitalization : yes Has patient had a PCN reaction occurring within the last 10 years: no If all of the above answers are "NO", then may proceed with Cephalosporin use.     Consultations:  Neurology  Psychiatry  Procedures/Studies: Ct Head Wo Contrast  Result Date: 07/17/2017 CLINICAL DATA:  Acute onset of altered mental  status. Question of overdose. Initial encounter. EXAM: CT HEAD WITHOUT CONTRAST TECHNIQUE: Contiguous axial images were obtained from the base of the skull through the vertex without intravenous contrast. COMPARISON:  CT of the head performed 04/25/2011 FINDINGS: Brain: No evidence of acute infarction, hemorrhage, hydrocephalus, extra-axial collection or mass lesion/mass effect. Chronic lacunar infarcts are noted at the left basal ganglia. The posterior fossa, including the cerebellum, brainstem and  fourth ventricle, is within normal limits. The third and lateral ventricles are unremarkable in appearance. The cerebral hemispheres are symmetric in appearance, with normal gray-white differentiation. No mass effect or midline shift is seen. Vascular: No hyperdense vessel or unexpected calcification. Skull: There is no evidence of fracture; visualized osseous structures are unremarkable in appearance. Sinuses/Orbits: The orbits are within normal limits. The paranasal sinuses and mastoid air cells are well-aerated. Other: No significant soft tissue abnormalities are seen. IMPRESSION: 1. No acute intracranial pathology seen on CT. 2. Chronic lacunar infarcts at the left basal ganglia. Electronically Signed   By: Roanna Raider M.D.   On: 07/17/2017 23:21   Mr Brain Wo Contrast  Result Date: 07/18/2017 CLINICAL DATA:  51 y/o  F; encephalopathy. EXAM: MRI HEAD WITHOUT CONTRAST TECHNIQUE: Multiplanar, multiecho pulse sequences of the brain and surrounding structures were obtained without intravenous contrast. COMPARISON:  07/17/2017 CT head FINDINGS: Brain: No acute infarction, hemorrhage, hydrocephalus, extra-axial collection or mass lesion. Few nonspecific foci of T2 FLAIR hyperintense signal abnormality in subcortical and periventricular white matter is compatible with mild chronic microvascular ischemic changes for age. Mild brain parenchymal volume loss. T2 hyperintense foci and left basal ganglia without surrounding FLAIR signal abnormality likely represent prominent perivascular spaces. Vascular: Normal flow voids. Skull and upper cervical spine: Normal marrow signal. Sinuses/Orbits: Negative. Other: None. IMPRESSION: 1. No acute intracranial abnormality. 2. Mild chronic microvascular ischemic changes and mild parenchymal volume loss of the brain. Electronically Signed   By: Mitzi Hansen M.D.   On: 07/18/2017 16:01   Dg Chest Port 1 View  Result Date: 07/19/2017 CLINICAL DATA:  Substance abuse,  hypertension, asthma, altered mental status EXAM: PORTABLE CHEST 1 VIEW COMPARISON:  Portable exam 0833 hours compared 07/17/2017 FINDINGS: Normal heart size, mediastinal contours, and pulmonary vascularity. Rotated to the RIGHT. Lungs clear. No infiltrate, pleural effusion, or pneumothorax. Osseous structures unremarkable. IMPRESSION: No acute abnormalities. Electronically Signed   By: Ulyses Southward M.D.   On: 07/19/2017 09:10   Dg Chest Port 1 View  Result Date: 07/17/2017 CLINICAL DATA:  Acute onset of altered mental status. Question of overdose. Initial encounter. EXAM: PORTABLE CHEST 1 VIEW COMPARISON:  Chest radiograph performed 07/22/2015 FINDINGS: The lungs are well-aerated. Mild bibasilar atelectasis is noted. There is no evidence of pleural effusion or pneumothorax. The cardiomediastinal silhouette is within normal limits. No acute osseous abnormalities are seen. IMPRESSION: Mild bibasilar atelectasis noted.  Lungs otherwise grossly clear Electronically Signed   By: Roanna Raider M.D.   On: 07/17/2017 22:41   Subjective: More alert this morning, no agitation/confusion overnight. Wants a long term intensive drug rehab program. Will go to [facility] on Monday.   Discharge Exam: Vitals:   07/21/17 2053 07/22/17 0520  BP: 130/85 (!) 132/92  Pulse: (!) 108 81  Resp: (!) 22 20  Temp: 98.8 F (37.1 C) 98.3 F (36.8 C)  SpO2: 94% 96%   General: Pt is alert, awake, not in acute distress Cardiovascular: RRR, S1/S2 +, no rubs, no gallops Respiratory: CTA bilaterally, no wheezing, no rhonchi Abdominal: Soft, NT, ND, bowel sounds +  Extremities: No edema, no cyanosis Psych: No current SI/HI.   Labs: Basic Metabolic Panel:  Recent Labs Lab 07/18/17 0505 07/19/17 0731 07/20/17 0256 07/21/17 0253 07/22/17 0408  NA 142 142 142 143 141  K 3.0* 3.2* 3.3* 3.7 3.1*  CL 109 114* 112* 113* 109  CO2 24 19* 23 24 24   GLUCOSE 96 71 99 100* 116*  BUN 13 13 7 12 10   CREATININE 0.81 0.74 0.74 0.75  0.74  CALCIUM 8.8* 8.5* 8.4* 8.1* 8.3*  MG  --   --   --  1.8  --    Liver Function Tests:  Recent Labs Lab 07/17/17 2220 07/20/17 0256  AST 21 12*  ALT 10* 9*  ALKPHOS 62 53  BILITOT 1.0 0.4  PROT 7.3 6.0*  ALBUMIN 4.2 3.2*    Recent Labs Lab 07/17/17 2220  LIPASE 21    Recent Labs Lab 07/17/17 2222  AMMONIA 24   CBC:  Recent Labs Lab 07/17/17 2220 07/18/17 0505 07/19/17 0731 07/20/17 0256  WBC 6.7 10.9* 5.9 7.1  NEUTROABS 5.5  --   --   --   HGB 14.0 13.3 12.5 12.0  HCT 40.2 39.0  40.2 36.8 35.0*  MCV 89.3 89.7 90.4 88.2  PLT 265 247 211 230   Urinalysis    Component Value Date/Time   COLORURINE AMBER (A) 07/17/2017 2336   APPEARANCEUR HAZY (A) 07/17/2017 2336   LABSPEC 1.021 07/17/2017 2336   PHURINE 5.0 07/17/2017 2336   GLUCOSEU NEGATIVE 07/17/2017 2336   HGBUR SMALL (A) 07/17/2017 2336   BILIRUBINUR NEGATIVE 07/17/2017 2336   KETONESUR 5 (A) 07/17/2017 2336   PROTEINUR NEGATIVE 07/17/2017 2336   UROBILINOGEN 0.2 07/22/2015 1325   NITRITE NEGATIVE 07/17/2017 2336   LEUKOCYTESUR MODERATE (A) 07/17/2017 2336   Microbiology Recent Results (from the past 240 hour(s))  Culture, blood (routine x 2)     Status: None (Preliminary result)   Collection Time: 07/17/17 10:47 PM  Result Value Ref Range Status   Specimen Description BLOOD LEFT ANTECUBITAL  Final   Special Requests   Final    BOTTLES DRAWN AEROBIC AND ANAEROBIC Blood Culture adequate volume   Culture   Final    NO GROWTH 4 DAYS Performed at [facility], [address] [place], [place] [address]    Report Status PENDING  Incomplete  Culture, blood (routine x 2)     Status: None (Preliminary result)   Collection Time: 07/17/17 10:48 PM  Result Value Ref Range Status   Specimen Description BLOOD RIGHT ANTECUBITAL  Final   Special Requests   Final    BOTTLES DRAWN AEROBIC AND ANAEROBIC Blood Culture adequate volume   Culture   Final    NO GROWTH 4 DAYS Performed at [facility], [address], [place], [place] [address]    Report Status PENDING  Incomplete  Culture, Urine     Status: Abnormal   Collection Time: 07/17/17 11:36 PM  Result Value Ref Range Status   Specimen Description URINE, CATHETERIZED  Final   Special Requests NONE  Final   Culture >=100,000 COLONIES/mL ESCHERICHIA COLI (A)  Final   Report Status 07/20/2017 FINAL  Final   Organism ID, Bacteria ESCHERICHIA COLI (A)  Final      Susceptibility   Escherichia coli - MIC*    AMPICILLIN 8 SENSITIVE Sensitive     CEFAZOLIN <=4 SENSITIVE Sensitive     CEFTRIAXONE <=1 SENSITIVE Sensitive     CIPROFLOXACIN <=0.25  SENSITIVE Sensitive     GENTAMICIN <=1 SENSITIVE Sensitive     IMIPENEM <=0.25 SENSITIVE Sensitive     NITROFURANTOIN <=16 SENSITIVE Sensitive     TRIMETH/SULFA <=20 SENSITIVE Sensitive     AMPICILLIN/SULBACTAM <=2 SENSITIVE Sensitive     PIP/TAZO <=4 SENSITIVE Sensitive     Extended ESBL NEGATIVE Sensitive     * >=100,000 COLONIES/mL ESCHERICHIA COLI  MRSA PCR Screening     Status: None   Collection Time: 07/18/17 12:35 PM  Result Value Ref Range Status   MRSA by PCR NEGATIVE NEGATIVE Final    Comment:        The GeneXpert MRSA Assay (FDA approved for NASAL specimens only), is one component of a comprehensive MRSA colonization surveillance program. It is not intended to diagnose MRSA infection nor to guide or monitor treatment for MRSA infections.     Time coordinating discharge: Approximately 40 minutes  Hazeline Junkeryan Taegan Standage, MD  [facility] 07/22/2017, 2:29 PM Pager [phone]

## 2017-07-22 NOTE — Progress Notes (Addendum)
Pt discharging home [facility]

## 2017-07-22 NOTE — Progress Notes (Signed)
PHARMACIST - PHYSICIAN COMMUNICATION  DR:   Jarvis NewcomerGrunz  CONCERNING: IV to Oral Route Change Policy  RECOMMENDATION: This patient is receiving Protonix, Folic acid, and Thiamine by the intravenous route.  Based on criteria approved by the Pharmacy and Therapeutics Committee, the intravenous medication(s) is/are being converted to the equivalent oral dose form(s).   DESCRIPTION: These criteria include:  The patient is eating (either orally or via tube) and/or has been taking other orally administered medications for a least 24 hours  The patient has no evidence of active gastrointestinal bleeding or impaired GI absorption (gastrectomy, short bowel, patient on TNA or NPO).  If you have questions about this conversion, please contact the Pharmacy Department   [phone]( [phone])  [facility]   Edwena Mayorga PharmD, [place] Pager [phone] 07/22/2017 9:13 AM

## 2017-07-22 NOTE — Progress Notes (Signed)
Pt states that she use Dartha LodgeAnthony Steele, FNP for PCP and [facility] [address] for her medications.

## 2017-07-23 LAB — CULTURE, BLOOD (ROUTINE X 2)
CULTURE: NO GROWTH
Culture: NO GROWTH
Special Requests: ADEQUATE
Special Requests: ADEQUATE

## 2017-11-25 ENCOUNTER — Other Ambulatory Visit: Payer: Self-pay | Admitting: Obstetrics and Gynecology

## 2017-11-25 DIAGNOSIS — Z1231 Encounter for screening mammogram for malignant neoplasm of breast: Secondary | ICD-10-CM

## 2017-12-15 ENCOUNTER — Encounter (HOSPITAL_COMMUNITY): Payer: Self-pay | Admitting: *Deleted

## 2017-12-15 ENCOUNTER — Ambulatory Visit (HOSPITAL_COMMUNITY)
Admission: RE | Admit: 2017-12-15 | Discharge: 2017-12-15 | Disposition: A | Discharge status: Home / Self Care | Payer: Self-pay | Source: Ambulatory Visit | Attending: Obstetrics and Gynecology | Admitting: Obstetrics and Gynecology

## 2017-12-15 ENCOUNTER — Ambulatory Visit
Admission: RE | Admit: 2017-12-15 | Discharge: 2017-12-15 | Disposition: A | Discharge status: Home / Self Care | Payer: No Typology Code available for payment source | Source: Ambulatory Visit | Attending: Obstetrics and Gynecology | Admitting: Obstetrics and Gynecology

## 2017-12-15 VITALS — BP 118/80 | Temp 98.2°F | Ht 60.0 in | Wt 162.6 lb

## 2017-12-15 DIAGNOSIS — Z1239 Encounter for other screening for malignant neoplasm of breast: Secondary | ICD-10-CM

## 2017-12-15 DIAGNOSIS — Z1231 Encounter for screening mammogram for malignant neoplasm of breast: Secondary | ICD-10-CM

## 2017-12-15 HISTORY — DX: Unspecified viral hepatitis C without hepatic coma: B19.20

## 2017-12-15 NOTE — Patient Instructions (Signed)
Explained breast self awareness with Kelly PolesSharon Edwards [place]. Patient did not need a Pap smear today due to last Pap smear was in October 2018 per patient. Let her know B[facility]CP will cover Pap smears every 3 years unless has a history of abnormal Pap smears. [facility]. Appointment scheduled for Thursday, December 15, 2017 at 1410. Let patient know the Breast Center will follow up with her within the next couple weeks with results of mammogram by letter or phone. Discussed smoking cessation with patient. Referred to the [facility] Q[facility] and gave resources to free smoking cessation classes at [facility]. Kelly Kelley verbalized understanding.  Kelly Kelley, [name], RN 1:36 PM

## 2017-12-15 NOTE — Progress Notes (Signed)
No complaints today.   Pap Smear: Pap smear not completed today. [facility] and normal per patient. Per patient has a history of an abnormal Pap smear in 1992 that cryotherapy was completed for follow-up.  Physical exam: Breasts Breasts symmetrical. No skin abnormalities bilateral breasts. No nipple retraction bilateral breasts. No nipple discharge bilateral breasts. No lymphadenopathy. No lumps palpated bilateral breasts. No complaints of pain or tenderness on exam. [facility] for a screening mamm[facility]. Appointment scheduled for Thursday, December 15, 2017 at 1410.        Pelvic/Bimanual No Pap smear completed today since last Pap smear was in October 2018 per patient. Pap smear not indicated per BCCCP guidelines.   Smoking History: Patient is a current smoker. Discussed smoking cessation with patient. Referred to the [facility] Quit[facility] and gave resources to free smoking cessation classes at [facility].  Patient Navigation: Patient education provided. Access to services provided for patient through BCCCP program.   Colorectal Cancer Screening: Per patient has never had a colonoscopy completed. No complaints today. FIT Test given to patient to complete and return to B[facility]CP.

## 2017-12-16 ENCOUNTER — Encounter (HOSPITAL_COMMUNITY): Payer: Self-pay | Admitting: *Deleted

## 2017-12-16 ENCOUNTER — Other Ambulatory Visit: Payer: Self-pay | Admitting: Obstetrics and Gynecology

## 2017-12-22 LAB — FECAL OCCULT BLOOD, IMMUNOCHEMICAL: Fecal Occult Bld: NEGATIVE

## 2017-12-22 LAB — SPECIMEN STATUS REPORT

## 2018-01-02 ENCOUNTER — Encounter (HOSPITAL_COMMUNITY): Payer: Self-pay | Admitting: *Deleted

## 2018-01-02 NOTE — Progress Notes (Signed)
Negative Fit Test results mailed to patient.  

## 2018-07-19 IMAGING — MG DIGITAL SCREENING BILATERAL MAMMOGRAM WITH CAD
4 series · 4 of 4 positions shown · non-contrast
Comparison: None.

CLINICAL DATA: Screening. Baseline.

EXAM:
DIGITAL SCREENING BILATERAL MAMMOGRAM WITH CAD

[R MLO]
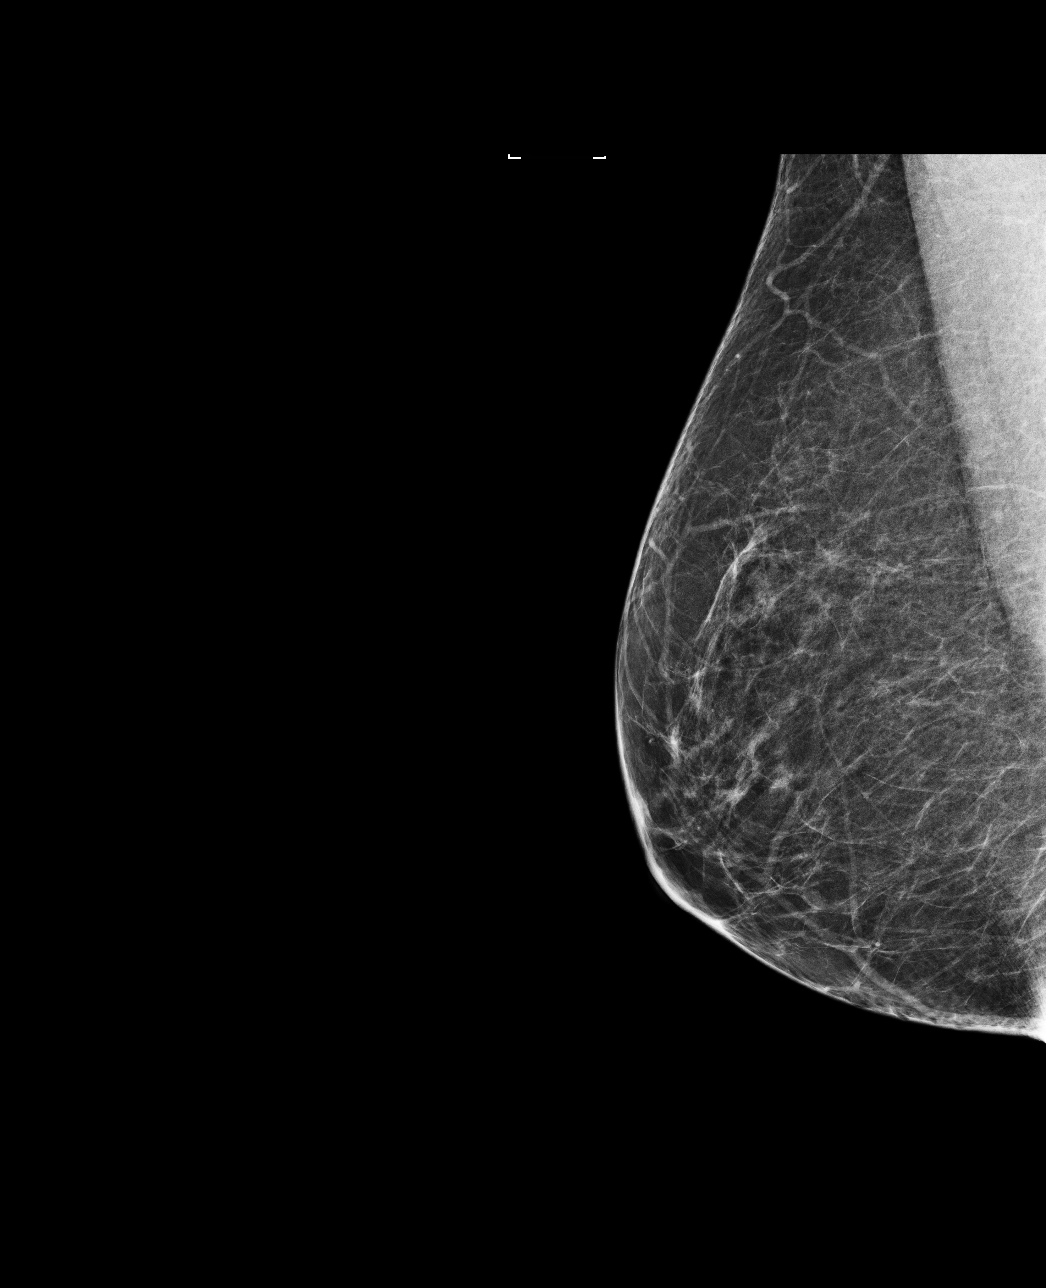

[R CC]
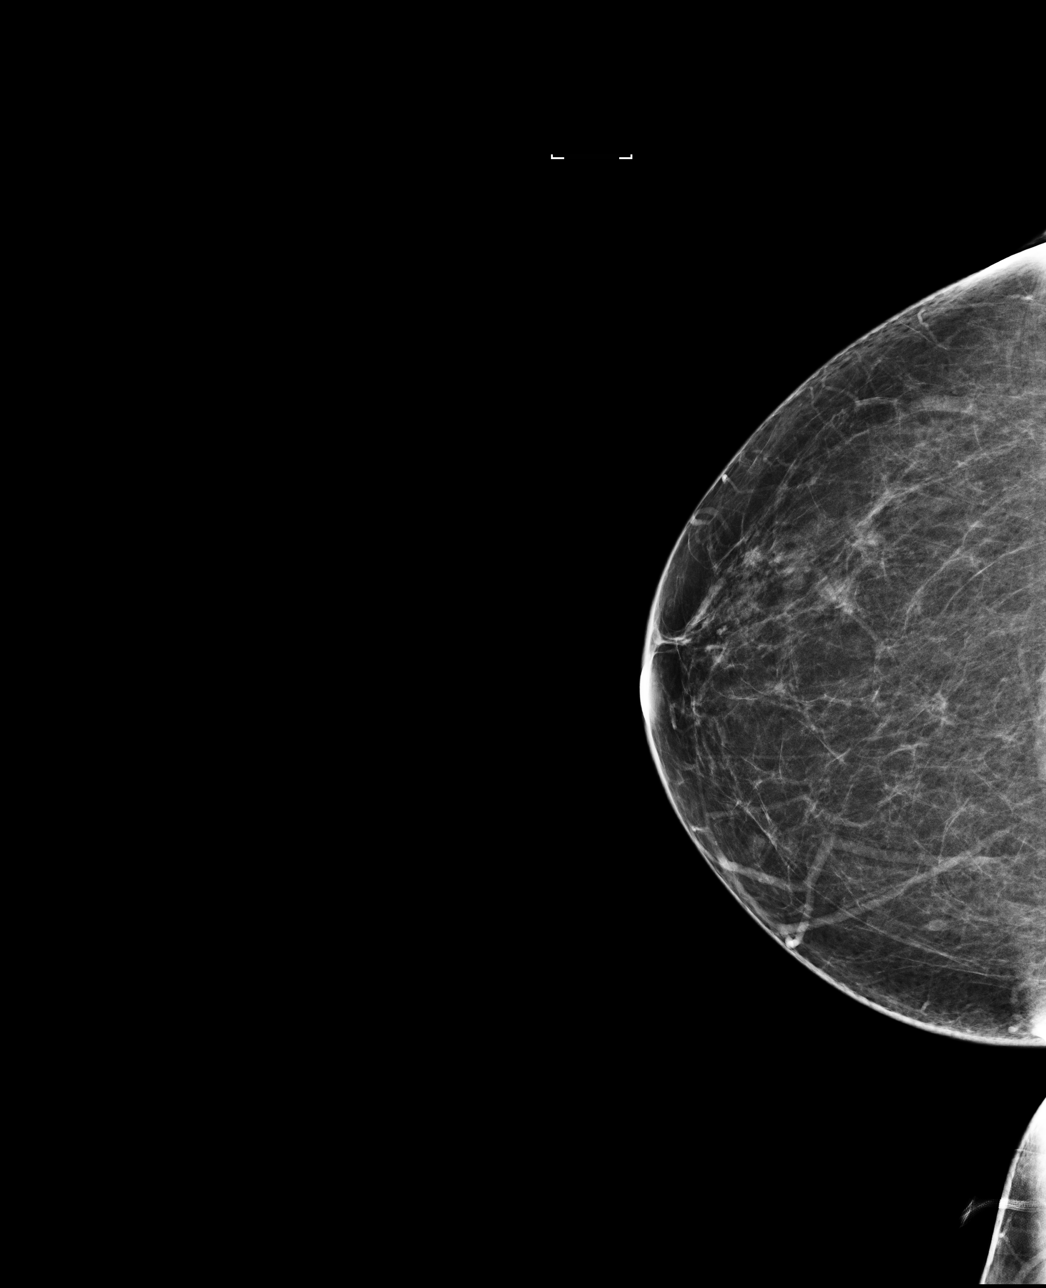

[L CC]
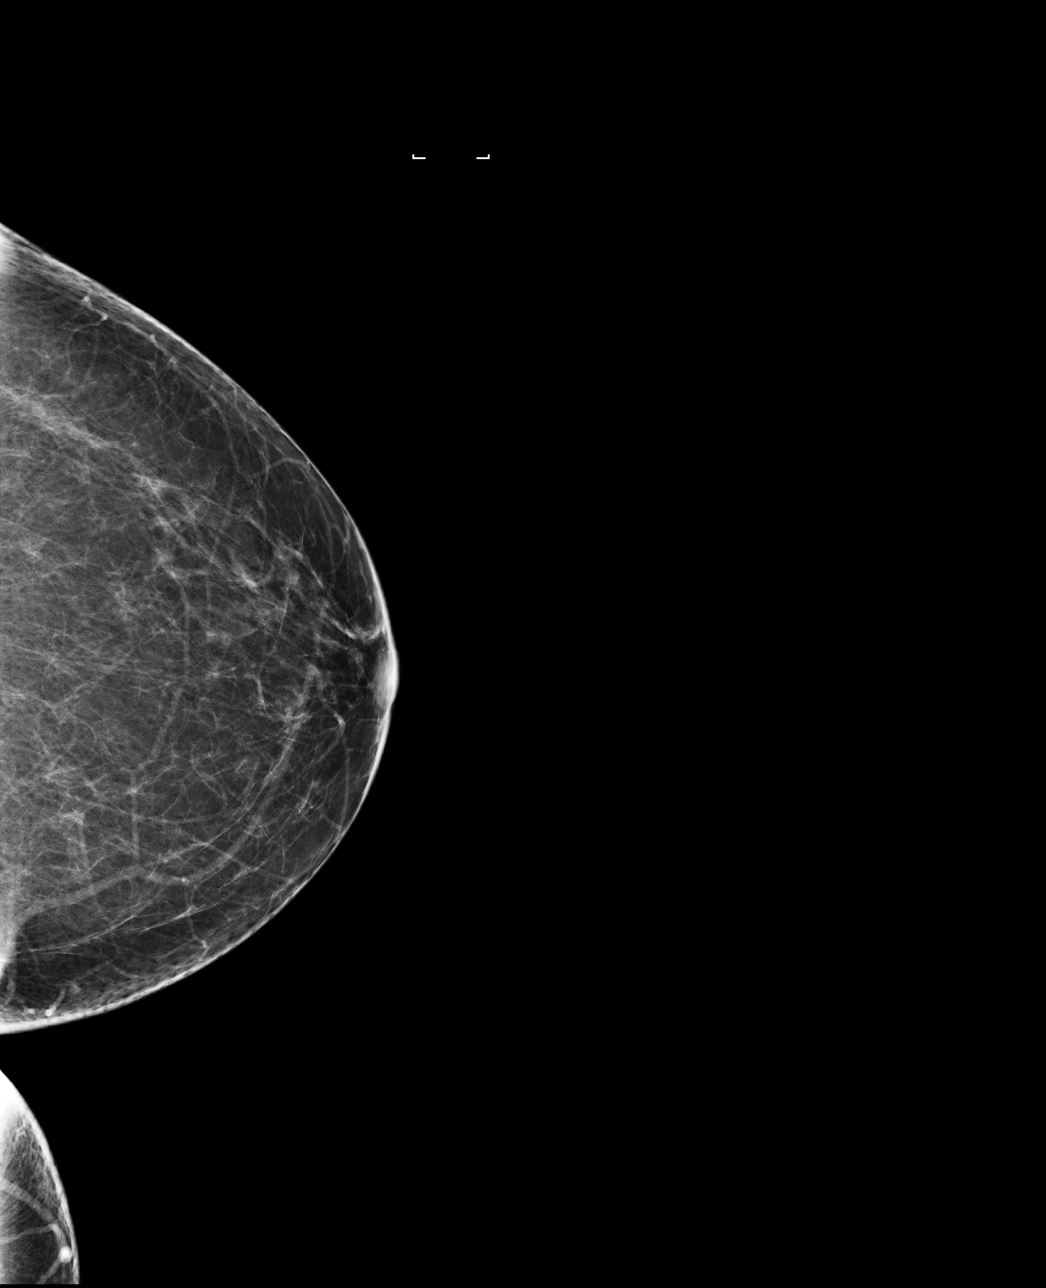

[L MLO]
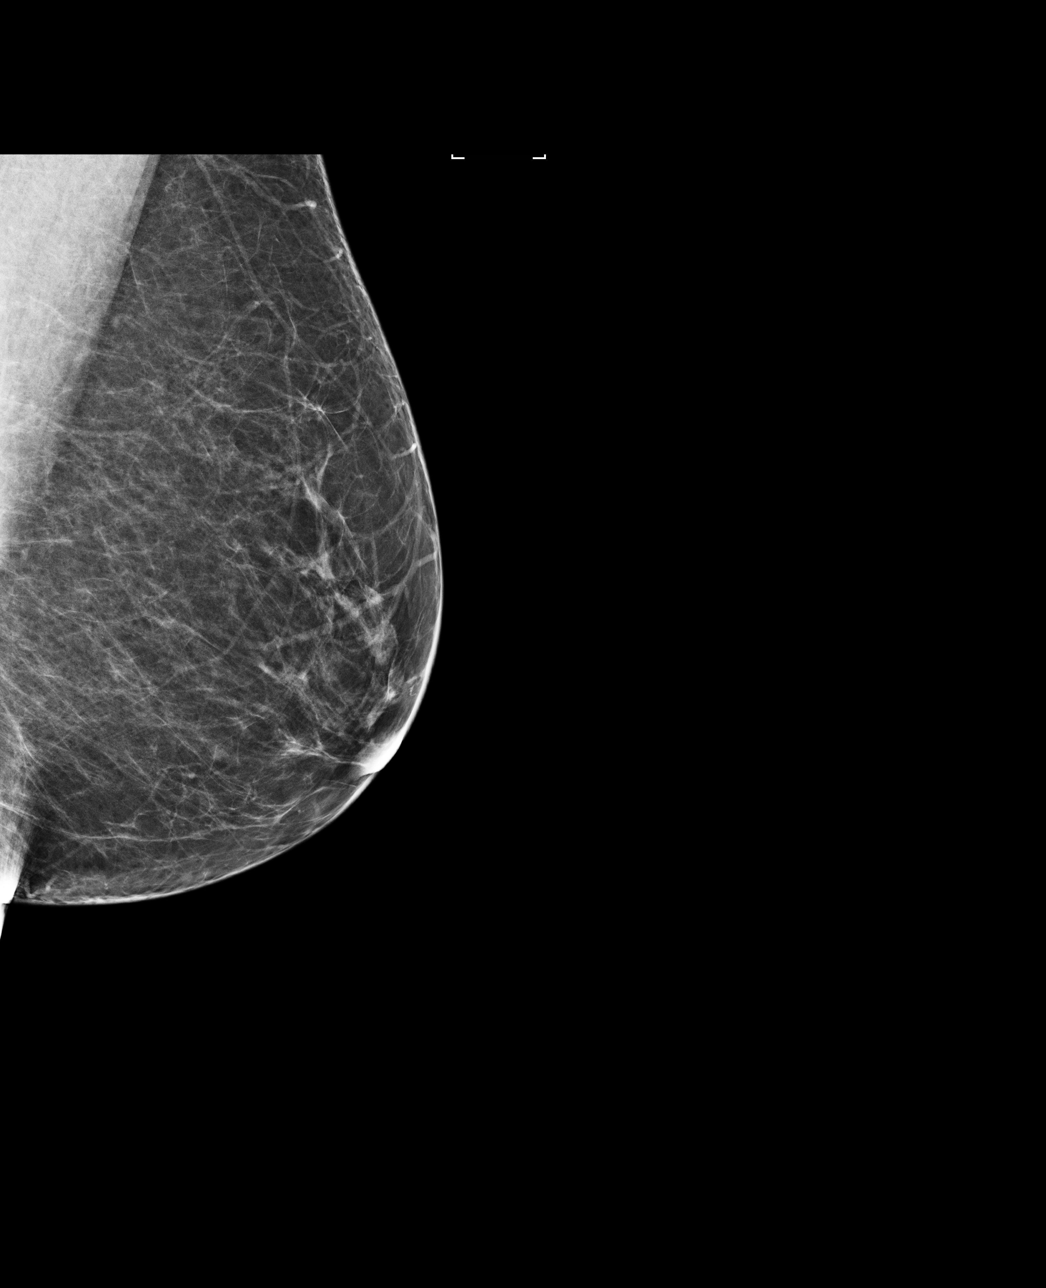

[4 of 4 positions shown; findings below may reference images not displayed]

ACR Breast Density Category b: There are scattered areas of
fibroglandular density.
FINDINGS: There are no findings suspicious for malignancy. Images were
processed with CAD.
IMPRESSION: No mammographic evidence of malignancy. A result letter of this
screening mammogram will be mailed directly to the patient.

RECOMMENDATION:
Screening mammogram in one year. (Code:N4-J-RDU)

BI-RADS CATEGORY  1: Negative.

## 2018-10-30 ENCOUNTER — Other Ambulatory Visit (HOSPITAL_COMMUNITY): Payer: Self-pay | Admitting: *Deleted

## 2018-10-30 DIAGNOSIS — N6320 Unspecified lump in the left breast, unspecified quadrant: Secondary | ICD-10-CM

## 2018-11-14 ENCOUNTER — Other Ambulatory Visit: Payer: No Typology Code available for payment source

## 2018-11-14 ENCOUNTER — Ambulatory Visit (HOSPITAL_COMMUNITY): Payer: No Typology Code available for payment source

## 2018-11-14 ENCOUNTER — Inpatient Hospital Stay: Admission: RE | Admit: 2018-11-14 | Payer: No Typology Code available for payment source | Source: Ambulatory Visit

## 2019-01-01 ENCOUNTER — Encounter (HOSPITAL_BASED_OUTPATIENT_CLINIC_OR_DEPARTMENT_OTHER): Payer: Self-pay

## 2019-01-01 ENCOUNTER — Other Ambulatory Visit: Payer: Self-pay

## 2019-01-01 ENCOUNTER — Emergency Department (HOSPITAL_BASED_OUTPATIENT_CLINIC_OR_DEPARTMENT_OTHER)
Admission: EM | Admit: 2019-01-01 | Discharge: 2019-01-01 | Disposition: A | Discharge status: Home / Self Care | Payer: Medicaid Other | Attending: Emergency Medicine | Admitting: Emergency Medicine

## 2019-01-01 DIAGNOSIS — N72 Inflammatory disease of cervix uteri: Secondary | ICD-10-CM

## 2019-01-01 DIAGNOSIS — Z79899 Other long term (current) drug therapy: Secondary | ICD-10-CM | POA: Diagnosis not present

## 2019-01-01 DIAGNOSIS — F17210 Nicotine dependence, cigarettes, uncomplicated: Secondary | ICD-10-CM | POA: Insufficient documentation

## 2019-01-01 DIAGNOSIS — J45909 Unspecified asthma, uncomplicated: Secondary | ICD-10-CM | POA: Insufficient documentation

## 2019-01-01 DIAGNOSIS — A599 Trichomoniasis, unspecified: Secondary | ICD-10-CM | POA: Diagnosis not present

## 2019-01-01 DIAGNOSIS — I10 Essential (primary) hypertension: Secondary | ICD-10-CM | POA: Insufficient documentation

## 2019-01-01 DIAGNOSIS — N898 Other specified noninflammatory disorders of vagina: Secondary | ICD-10-CM | POA: Diagnosis present

## 2019-01-01 LAB — URINALYSIS, ROUTINE W REFLEX MICROSCOPIC
Bilirubin Urine: NEGATIVE
Glucose, UA: NEGATIVE mg/dL
Ketones, ur: NEGATIVE mg/dL
Nitrite: NEGATIVE
PROTEIN: NEGATIVE mg/dL
SPECIFIC GRAVITY, URINE: 1.025 (ref 1.005–1.030)
pH: 6 (ref 5.0–8.0)

## 2019-01-01 LAB — URINALYSIS, MICROSCOPIC (REFLEX)

## 2019-01-01 LAB — WET PREP, GENITAL
Sperm: NONE SEEN
Yeast Wet Prep HPF POC: NONE SEEN

## 2019-01-01 LAB — PREGNANCY, URINE: PREG TEST UR: NEGATIVE

## 2019-01-01 MED ORDER — DOXYCYCLINE HYCLATE 100 MG PO TABS
100.0000 mg | ORAL_TABLET | Freq: Once | ORAL | Status: AC
  Administered 2019-01-01: 100 mg via ORAL
  Filled 2019-01-01: qty 1

## 2019-01-01 MED ORDER — AZITHROMYCIN 250 MG PO TABS
1000.0000 mg | ORAL_TABLET | Freq: Once | ORAL | Status: AC
  Administered 2019-01-01: 1000 mg via ORAL
  Filled 2019-01-01: qty 4

## 2019-01-01 MED ORDER — METRONIDAZOLE 500 MG PO TABS
ORAL_TABLET | ORAL | Status: AC
  Filled 2019-01-01: qty 4

## 2019-01-01 MED ORDER — DOXYCYCLINE HYCLATE 100 MG PO CAPS: 100.0000 mg | ORAL_CAPSULE | Freq: Two times a day (BID) | ORAL | 0 refills | Status: DC

## 2019-01-01 MED ORDER — METRONIDAZOLE 500 MG PO TABS
2000.0000 mg | ORAL_TABLET | Freq: Once | ORAL | Status: AC
  Administered 2019-01-01: 2000 mg via ORAL
  Filled 2019-01-01: qty 4

## 2019-01-01 NOTE — ED Provider Notes (Signed)
[facility] EMERGENCY DEPARTMENT Provider Note   CSN: [id number] Arrival date & time: 01/01/19  1854     History   Chief Complaint Chief Complaint  Patient presents with  . Vaginal Discharge    HPI Kelly Kelley is a 53 y.o. female.  The history is provided by the patient.  Vaginal Discharge  Quality:  Bloody and milky Severity:  Moderate Onset quality:  Gradual Duration:  3 months Timing:  Constant Progression:  Worsening Chronicity:  New Context: spontaneously   Relieved by:  Nothing Worsened by:  Nothing Ineffective treatments:  None tried Associated symptoms: no abdominal pain and no fever   Risk factors: no endometriosis   Partner was treated  Past Medical History:  Diagnosis Date  . Anxiety   . Asthma   . Bipolar disorder (HCC)   . Borderline personality disorder (HCC)   . Essential hypertension 03/31/2016  . Hepatitis C   . Insomnia   . PTSD (post-traumatic stress disorder)   . Substance abuse [facility])     Patient Active Problem List   Diagnosis Date Noted  . Altered mental status   . Acute encephalopathy 07/18/2017  . Chronic hepatitis C without hepatic coma (HCC) 03/31/2016  . Generalized anxiety disorder 03/31/2016  . Substance abuse (HCC) 03/31/2016  . Insomnia 03/31/2016  . GERD (gastroesophageal reflux disease) 03/31/2016  . Severe depressed bipolar I disorder (HCC) 03/31/2016  . Essential hypertension 03/31/2016    Past Surgical History:  Procedure Laterality Date  . CESAREAN SECTION    . TUBAL LIGATION       OB History    Gravida  3   Para      Term      Preterm      AB      Living  3     SAB      TAB      Ectopic      Multiple      Live Births  3            Home Medications    Prior to Admission medications   Medication Sig Start Date End Date Taking? Authorizing Provider  albuterol (PROVENTIL HFA;VENTOLIN HFA) 108 (90 Base) MCG/ACT inhaler Inhale 2 puffs into the lungs every 6 (six)  hours as needed for wheezing or shortness of breath.    [provider]  ARIPiprazole (ABILIFY) 10 MG tablet Take 10 mg by mouth daily.    [provider]  furosemide (LASIX) 10 MG/ML solution Take by mouth daily.    [provider]  lisinopril (PRINIVIL,ZESTRIL) 20 MG tablet Take 20 mg by mouth daily.    [provider]  Multiple Vitamins-Minerals (MULTIVITAMIN WITH MINERALS) tablet Take 1 tablet by mouth daily.    [provider]  omeprazole (PRILOSEC) 20 MG capsule Take 20 mg by mouth daily.    [provider]  ranitidine (ZANTAC) 150 MG tablet Take 150 mg by mouth 2 (two) times daily.    [provider]    Family History Family History  Problem Relation Age of Onset  . Cancer Mother        ovarian  . Heart attack Father   . Hypertension Father   . Cancer Father        skin    Social History Social History   Tobacco Use  . Smoking status: Current Every Day Smoker    Packs/day: 1.00  . Smokeless tobacco: Never Used  Substance Use Topics  .  Alcohol use: No    Alcohol/week: 0.0 standard drinks    Frequency: Never  . Drug use: Yes    Types: Cocaine     Allergies   Morphine and related and Penicillins   Review of Systems Review of Systems  Constitutional: Negative for fever.  Gastrointestinal: Negative for abdominal pain.  Genitourinary: Positive for vaginal discharge.  All other systems reviewed and are negative.    Physical Exam Updated Vital Signs BP 132/90 (BP Location: Right Arm)   Pulse 80   Temp 98.9 F (37.2 C) (Oral)   Resp 18   Ht 5' (1.524 m)   Wt 59.9 kg   LMP 06/07/2012   SpO2 100%   BMI 25.78 kg/m   Physical Exam Exam conducted with a chaperone present.  Constitutional:      Appearance: Normal appearance.  HENT:     Head: Normocephalic and atraumatic.     Nose: Nose normal.  Neck:     Musculoskeletal: Normal range of motion and neck supple.  Cardiovascular:     Rate and  Rhythm: Normal rate and regular rhythm.     Pulses: Normal pulses.     Heart sounds: Normal heart sounds.  Pulmonary:     Effort: Pulmonary effort is normal.     Breath sounds: Normal breath sounds.  Abdominal:     General: Abdomen is flat.     Tenderness: There is no abdominal tenderness.  Genitourinary:    Vagina: Vaginal discharge present. No bleeding.  Musculoskeletal: Normal range of motion.  Skin:    General: Skin is warm and dry.     Capillary Refill: Capillary refill takes less than 2 seconds.  Neurological:     General: No focal deficit present.     Mental Status: She is alert and oriented to person, place, and time.  Psychiatric:        Mood and Affect: Mood normal.      ED Treatments / Results  Labs (all labs ordered are listed, but only abnormal results are displayed) Labs Reviewed  URINALYSIS, ROUTINE W REFLEX MICROSCOPIC - Abnormal; Notable for the following components:      Result Value   Hgb urine dipstick TRACE (*)    Leukocytes, UA MODERATE (*)    All other components within normal limits  URINALYSIS, MICROSCOPIC (REFLEX) - Abnormal; Notable for the following components:   Bacteria, UA RARE (*)    All other components within normal limits  PREGNANCY, URINE    EKG None  Radiology No results found.  Procedures Procedures (including critical care time)  Medications Ordered in ED Medications  azithromycin (ZITHROMAX) tablet 1,000 mg (has no administration in time range)  doxycycline (VIBRA-TABS) tablet 100 mg (has no administration in time range)     Final Clinical Impressions(s) / ED Diagnoses   Informed that vaginal bleeding post menopause is never normal she must follow up with GYN for bleeding and pap smear with chaperone present she verbalizes understanding and agrees to follow up.   Return for pain, intractable cough, productive cough,fevers >100.4 unrelieved by medication, shortness of breath, intractable vomiting, or diarrhea,  abdominal pain, Inability to tolerate liquids or food, cough, altered mental status or any concerns. No signs of systemic illness or infection. The patient is nontoxic-appearing on exam and vital signs are within normal limits.   I have reviewed the triage vital signs and the nursing notes. Pertinent labs &imaging results that were available during my care of the patient were reviewed by me  and considered in my medical decision making (see chart for details).  After history, exam, and medical workup I feel the patient has been appropriately medically screened and is safe for discharge home. Pertinent diagnoses were discussed with the patient. Patient was given return precautions.   Stein Windhorst, MD 01/01/19 2308

## 2019-01-01 NOTE — ED Triage Notes (Signed)
C/o vaginal d/c x 1 month-vaginal bleeding x 4 days-no menstrual pd x 5 yrs-NAD-steady gait

## 2019-01-01 NOTE — ED Notes (Signed)
Pt upset in triage about the wait time.  Pt updated on delay.  Pt has been noted to be going outside multiple times while waiting.

## 2019-01-03 LAB — GC/CHLAMYDIA PROBE AMP (~~LOC~~) NOT AT ARMC
CHLAMYDIA, DNA PROBE: NEGATIVE
Neisseria Gonorrhea: NEGATIVE

## 2021-03-05 ENCOUNTER — Ambulatory Visit: Payer: Medicaid Other | Admitting: Physical Therapy

## 2021-03-11 ENCOUNTER — Encounter: Payer: Self-pay | Admitting: Rehabilitative and Restorative Service Providers"

## 2021-03-11 ENCOUNTER — Ambulatory Visit: Payer: Medicaid Other | Attending: Endocrinology | Admitting: Rehabilitative and Restorative Service Providers"

## 2021-03-11 ENCOUNTER — Other Ambulatory Visit: Payer: Self-pay

## 2021-03-11 DIAGNOSIS — M25551 Pain in right hip: Secondary | ICD-10-CM | POA: Diagnosis present

## 2021-03-11 DIAGNOSIS — M6281 Muscle weakness (generalized): Secondary | ICD-10-CM | POA: Diagnosis present
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
## 2021-03-18 ENCOUNTER — Ambulatory Visit: Payer: Medicaid Other

## 2021-03-25 ENCOUNTER — Ambulatory Visit: Payer: Medicaid Other | Admitting: Physical Therapy

## 2021-04-01 ENCOUNTER — Ambulatory Visit: Payer: Medicaid Other | Admitting: Rehabilitative and Restorative Service Providers"

## 2021-04-08 ENCOUNTER — Encounter: Payer: No Typology Code available for payment source | Admitting: Rehabilitative and Restorative Service Providers"

## 2021-04-29 ENCOUNTER — Encounter: Payer: Self-pay | Admitting: *Deleted

## 2021-05-18 ENCOUNTER — Other Ambulatory Visit: Payer: Self-pay

## 2021-05-18 ENCOUNTER — Encounter: Payer: Self-pay | Admitting: Medical

## 2021-05-18 ENCOUNTER — Ambulatory Visit: Payer: Medicaid Other | Admitting: Medical

## 2021-05-18 ENCOUNTER — Other Ambulatory Visit (HOSPITAL_COMMUNITY)
Admission: RE | Admit: 2021-05-18 | Discharge: 2021-05-18 | Disposition: A | Discharge status: Home / Self Care | Payer: Medicaid Other | Source: Ambulatory Visit | Attending: Medical | Admitting: Medical

## 2021-05-18 VITALS — BP 114/75 | HR 68 | Ht 60.0 in | Wt 146.0 lb

## 2021-05-18 DIAGNOSIS — Z1231 Encounter for screening mammogram for malignant neoplasm of breast: Secondary | ICD-10-CM

## 2021-05-18 DIAGNOSIS — R87613 High grade squamous intraepithelial lesion on cytologic smear of cervix (HGSIL): Secondary | ICD-10-CM | POA: Insufficient documentation

## 2021-05-18 NOTE — Progress Notes (Signed)
Patient has elevated phq9 & gad7- has psychiatrist she sees once a week

## 2021-05-18 NOTE — Progress Notes (Signed)
    GYNECOLOGY CLINIC COLPOSCOPY PROCEDURE NOTE  Ms. Kelly Kelley is a 55 y.o. G3P0 here for colposcopy for high-grade squamous intraepithelial neoplasia  (HGSIL-encompassing moderate and severe dysplasia) pap smear on 04/06/21. Discussed role for HPV in cervical dysplasia, need for surveillance.  Patient given informed consent, signed copy in the chart, time out was performed.  Placed in lithotomy position. Cervix viewed with speculum and colposcope after application of acetic acid.   Colposcopy adequate? No  no visible lesions and punctation noted at 3 o'clock; biopsies obtained.  ECC specimen attempted but difficult due to cervical stenosis.  All specimens were labelled and sent to pathology.   Patient was given post procedure instructions.  Will follow up pathology and manage accordingly.  Routine preventative health maintenance measures emphasized. Mammogram ordered. Patient unwilling to schedule now as she has an upcoming court date.    Marny Lowenstein, PA-C 05/18/2021 4:34 PM

## 2021-05-18 NOTE — Patient Instructions (Signed)

## 2021-05-18 NOTE — Addendum Note (Signed)
Addended by: Marny Lowenstein on: 05/18/2021 04:39 PM   Modules accepted: Orders

## 2021-05-22 LAB — SURGICAL PATHOLOGY

## 2021-05-26 ENCOUNTER — Telehealth: Payer: Self-pay | Admitting: General Practice

## 2021-05-26 NOTE — Telephone Encounter (Signed)
Called patient and discussed results with her as well as LEEP procedure. Patient verbalized understanding and states she has to go to court on 6/28 and will very likely be in jail for a while after. Patient wants to know if there is any way this can happen before then. Patient also states she will be out of town 6/23. Told patient I would reach out to our front office staff and someone would get in touch with her regarding an appt. Patient verbalized understanding.

## 2021-05-26 NOTE — Telephone Encounter (Signed)
-----   Message from Reva Bores, MD sent at 05/25/2021  1:12 PM EDT ----- Needs LEEP

## 2021-06-08 ENCOUNTER — Encounter: Payer: Self-pay | Admitting: Family Medicine

## 2021-06-08 ENCOUNTER — Other Ambulatory Visit: Payer: Self-pay

## 2021-06-08 ENCOUNTER — Other Ambulatory Visit (HOSPITAL_COMMUNITY)
Admission: RE | Admit: 2021-06-08 | Discharge: 2021-06-08 | Disposition: A | Discharge status: Home / Self Care | Payer: Medicaid Other | Source: Ambulatory Visit | Attending: Family Medicine | Admitting: Family Medicine

## 2021-06-08 ENCOUNTER — Ambulatory Visit (INDEPENDENT_AMBULATORY_CARE_PROVIDER_SITE_OTHER): Payer: Medicaid Other | Admitting: Family Medicine

## 2021-06-08 VITALS — BP 115/90 | HR 83 | Ht 60.0 in | Wt 147.9 lb

## 2021-06-08 DIAGNOSIS — R87613 High grade squamous intraepithelial lesion on cytologic smear of cervix (HGSIL): Secondary | ICD-10-CM

## 2021-06-08 DIAGNOSIS — Z3202 Encounter for pregnancy test, result negative: Secondary | ICD-10-CM

## 2021-06-08 LAB — POCT PREGNANCY, URINE: Preg Test, Ur: NEGATIVE

## 2021-06-08 NOTE — Progress Notes (Signed)
   GYNECOLOGY OFFICE PROCEDURE NOTE  Kelly Kelley is a 55 y.o. [phone] here for LEEP. No GYN concerns. Pap smear and colposcopy history reviewed.    Pap HSIL Colpo Biopsy CIN1 at 3 oclock, with scant CIN2 p16 high grade ECC not obtained at colpo  Risks, benefits, alternatives, and limitations of procedure explained to patient, including pain, bleeding, infection, failure to remove abnormal tissue and failure to cure dysplasia, need for repeat procedures, damage to pelvic organs, cervical incompetence.  Role of HPV,cervical dysplasia and need for close followup was empasized. Informed written consent was obtained. All questions were answered. Time out performed. Urine pregnancy test was negative.  ??Procedure: The patient was placed in lithotomy position and the bivalved coated speculum was placed in the patient's vagina. A grounding pad placed on the patient.   Local anesthesia was administered via an intracervical block using 10 ml of 2% Lidocaine with epinephrine. The suction was turned on and the Medium 1X Fisher [facility] Biopsy Excisor on 52 Watts of blended current was used to excise the area of decreased uptake and excise the entire transformation zone. Excellent hemostasis was achieved using roller ball coagulation set at 60 Watts coagulation current. The speculum was removed from the vagina. Specimens were sent to pathology.  ?The patient tolerated the procedure well. Post-operative instructions given to patient, including instruction to seek medical attention for persistent bright red bleeding, fever, abdominal/pelvic pain, dysuria, nausea or vomiting. She was also told about the possibility of having copious yellow to black tinged discharge for weeks. She was counseled to avoid anything in the vagina (sex/douching/tampons) for 3 weeks. She has a 4 week post-operative check to assess wound healing, review results and discuss further management.   Dr. Shawnie Pons present for LEEP  Federico Flake, MD, MPH, ABFM, [facility] Attending Physician Center for [facility]

## 2021-06-10 ENCOUNTER — Telehealth: Payer: Self-pay

## 2021-06-10 LAB — SURGICAL PATHOLOGY

## 2021-06-10 NOTE — Telephone Encounter (Signed)
Spoke with pt. Pt given results per Dr Alvester Morin. Pt verbalized understanding and agreeable to plan of care. Pt has scheduled 1 month LEEP follow up appt on 7/25.   Judeth Cornfield, RN

## 2021-06-10 NOTE — Telephone Encounter (Signed)
-----   Message from Federico Flake, MD sent at 06/10/2021  1:20 PM EDT ----- LEEP excision with only CIN1, margins not involved.  Great news!   Patient needs routine LEEP follow up exam (already scheduled) and will need a pap in 1year.

## 2021-06-18 ENCOUNTER — Ambulatory Visit (INDEPENDENT_AMBULATORY_CARE_PROVIDER_SITE_OTHER): Payer: Medicaid Other | Admitting: Family Medicine

## 2021-06-18 ENCOUNTER — Other Ambulatory Visit: Payer: Self-pay

## 2021-06-18 VITALS — BP 129/86 | HR 86 | Wt 148.0 lb

## 2021-06-18 DIAGNOSIS — R102 Pelvic and perineal pain: Secondary | ICD-10-CM | POA: Diagnosis not present

## 2021-06-18 MED ORDER — CYCLOBENZAPRINE HCL 10 MG PO TABS: 10.0000 mg | ORAL_TABLET | Freq: Three times a day (TID) | ORAL | 1 refills | Status: DC | PRN

## 2021-06-18 MED ORDER — CYCLOBENZAPRINE HCL 10 MG PO TABS
10.0000 mg | ORAL_TABLET | Freq: Three times a day (TID) | ORAL | 1 refills | Status: DC | PRN
Start: 2021-06-18 — End: 2021-06-18

## 2021-06-18 MED ORDER — DOXYCYCLINE HYCLATE 100 MG PO CAPS
100.0000 mg | ORAL_CAPSULE | Freq: Two times a day (BID) | ORAL | 0 refills | Status: AC
Start: 2021-06-18 — End: ?

## 2021-06-18 NOTE — Progress Notes (Signed)
   GYNECOLOGY PROBLEM  VISIT ENCOUNTER NOTE  Subjective:   Kelly Kelley is a 55 y.o. [phone] female here for a a problem GYN visit.  Current complaints: pelvic and back pain. Reports it started after her LEEP. She reports spasms in her back and legs. She was in so much pain that she admits to using cocaine today.   Denies abnormal vaginal bleeding, discharge, pelvic pain, problems with intercourse or other gynecologic concerns.    Gynecologic History Patient's last menstrual period was 06/07/2012. Contraception: post menopausal status  Health Maintenance Due  Topic Date Due   COVID-19 Vaccine (1) Never done   Pneumococcal Vaccine 85-64 Years old (1 - PCV) Never done   TETANUS/TDAP  Never done   PAP SMEAR-Modifier  Never done   COLONOSCOPY (Pts 45-50yrs Insurance coverage will need to be confirmed)  Never done   Zoster Vaccines- Shingrix (1 of 2) Never done   MAMMOGRAM  12/16/2019     The following portions of the patient's history were reviewed and updated as appropriate: allergies, current medications, past family history, past medical history, past social history, past surgical history and problem list.  Review of Systems Pertinent items are noted in HPI.   Objective:  BP 129/86   Pulse 86   Wt 148 lb (67.1 kg)   LMP 06/07/2012   BMI 28.90 kg/m  Gen: well appearing, NAD HEENT: no scleral icterus CV: RR Lung: Normal WOB Ext: warm well perfused  PELVIC: Normal appearing external genitalia; normal appearing vaginal mucosa and cervix. Cervix appears normal after LEEP, yellow discharge. Speculum exam was painful for patient. Normal uterine size, no other palpable masses, no uterine or adnexal tenderness.   Assessment and Plan:  1. Pelvic pain I think her back/pelvic pain is less related to LEEP but cannot rule out some infectious process.  It is possible that positioning for LEEP did aggravate longstanding neuropathic pain. Will empirically treat with doxy today.   - Reviewed use of tylenol and NSAIDS - Reviewed rx for flexeril.  - cyclobenzaprine (FLEXERIL) 10 MG tablet; Take 1 tablet (10 mg total) by mouth every 8 (eight) hours as needed for muscle spasms.  Dispense: 30 tablet; Refill: 1 - doxycycline (VIBRAMYCIN) 100 MG capsule; Take 1 capsule (100 mg total) by mouth 2 (two) times daily.  Dispense: 14 capsule; Refill: 0   Please refer to After Visit Summary for other counseling recommendations.   Return in about 1 week (around 06/25/2021) for f/u pelvic pain.  Future Appointments  Date Time Provider Department Center  07/06/2021 10:35 AM Marylene Land, CNM [facility]    Federico Flake, MD, MPH, ABFM Attending Physician Faculty Practice- Center for [facility]

## 2021-06-18 NOTE — Progress Notes (Deleted)
   GYNECOLOGY PROBLEM  VISIT ENCOUNTER NOTE  Subjective:   Kanna Dafoe is a 55 y.o. [phone] female here for a a problem GYN visit.  Current complaints: ***.   Denies abnormal vaginal bleeding, discharge, pelvic pain, problems with intercourse or other gynecologic concerns.    Gynecologic History Patient's last menstrual period was 06/07/2012. Contraception: {method:5051}  Health Maintenance Due  Topic Date Due   COVID-19 Vaccine (1) Never done   Pneumococcal Vaccine 72-14 Years old (1 - PCV) Never done   TETANUS/TDAP  Never done   PAP SMEAR-Modifier  Never done   COLONOSCOPY (Pts 45-56yrs Insurance coverage will need to be confirmed)  Never done   Zoster Vaccines- Shingrix (1 of 2) Never done   MAMMOGRAM  12/16/2019     The following portions of the patient's history were reviewed and updated as appropriate: allergies, current medications, past family history, past medical history, past social history, past surgical history and problem list.  Review of Systems {ros; complete:30496}   Objective:  BP 129/86   Pulse 86   Wt 148 lb (67.1 kg)   LMP 06/07/2012   BMI 28.90 kg/m  Gen: well appearing, NAD HEENT: no scleral icterus CV: RR Lung: Normal WOB Ext: warm well perfused  PELVIC: Normal appearing external genitalia; normal appearing vaginal mucosa and cervix.  No abnormal discharge noted.  ***Pap smear obtained.  Normal uterine size, no other palpable masses, no uterine or adnexal tenderness.   Assessment and Plan:  1. Pelvic pain *** - cyclobenzaprine (FLEXERIL) 10 MG tablet; Take 1 tablet (10 mg total) by mouth every 8 (eight) hours as needed for muscle spasms.  Dispense: 30 tablet; Refill: 1 - doxycycline (VIBRAMYCIN) 100 MG capsule; Take 1 capsule (100 mg total) by mouth 2 (two) times daily.  Dispense: 14 capsule; Refill: 0   Please refer to After Visit Summary for other counseling recommendations.   No follow-ups on file.  Future Appointments  Date  Time Provider Department Center  07/06/2021 10:35 AM Marylene Land, CNM [facility]     Federico Flake, MD, MPH, ABFM Attending Physician Faculty Practice- Center for [facility]

## 2021-06-22 ENCOUNTER — Telehealth: Payer: Self-pay | Admitting: Lactation Services

## 2021-06-22 ENCOUNTER — Encounter: Payer: Self-pay | Admitting: Family Medicine

## 2021-06-22 NOTE — Telephone Encounter (Signed)
Called patient at request of Dr. Alvester Morin and spoke with patient. Patient reports she is feeling better and the pain is a lot better. She was appreciative of call to check on her. She has no questions or concerns at this time. She will follow up in the office on 10//25. Patient aware.

## 2021-06-22 NOTE — Telephone Encounter (Signed)
-----   Message from Federico Flake, MD sent at 06/22/2021  9:53 AM EDT ----- Please call to check on the patient after urgent appt on 7/7. She was put on doxycycline and I would like to see how her symptoms are doing/improving.

## 2021-07-06 ENCOUNTER — Ambulatory Visit: Payer: No Typology Code available for payment source | Admitting: Student

## 2021-10-11 ENCOUNTER — Emergency Department (HOSPITAL_BASED_OUTPATIENT_CLINIC_OR_DEPARTMENT_OTHER)
Admission: EM | Admit: 2021-10-11 | Discharge: 2021-10-11 | Disposition: A | Discharge status: Home / Self Care | Payer: Medicaid Other | Attending: Emergency Medicine | Admitting: Emergency Medicine

## 2021-10-11 ENCOUNTER — Other Ambulatory Visit: Payer: Self-pay

## 2021-10-11 ENCOUNTER — Encounter (HOSPITAL_BASED_OUTPATIENT_CLINIC_OR_DEPARTMENT_OTHER): Payer: Self-pay | Admitting: *Deleted

## 2021-10-11 DIAGNOSIS — M545 Low back pain, unspecified: Secondary | ICD-10-CM | POA: Diagnosis present

## 2021-10-11 DIAGNOSIS — Z79899 Other long term (current) drug therapy: Secondary | ICD-10-CM | POA: Diagnosis not present

## 2021-10-11 DIAGNOSIS — I10 Essential (primary) hypertension: Secondary | ICD-10-CM | POA: Insufficient documentation

## 2021-10-11 DIAGNOSIS — J45909 Unspecified asthma, uncomplicated: Secondary | ICD-10-CM | POA: Diagnosis not present

## 2021-10-11 DIAGNOSIS — F17210 Nicotine dependence, cigarettes, uncomplicated: Secondary | ICD-10-CM | POA: Diagnosis not present

## 2021-10-11 DIAGNOSIS — M5441 Lumbago with sciatica, right side: Secondary | ICD-10-CM | POA: Diagnosis not present

## 2021-10-11 MED ORDER — PREDNISONE 50 MG PO TABS
60.0000 mg | ORAL_TABLET | Freq: Once | ORAL | Status: AC
  Administered 2021-10-11: 60 mg via ORAL
  Filled 2021-10-11: qty 1

## 2021-10-11 MED ORDER — CYCLOBENZAPRINE HCL 5 MG PO TABS: 5.0000 mg | ORAL_TABLET | Freq: Three times a day (TID) | ORAL | 0 refills | Status: AC | PRN

## 2021-10-11 MED ORDER — PREDNISONE 20 MG PO TABS: 40.0000 mg | ORAL_TABLET | Freq: Every day | ORAL | 0 refills | Status: AC

## 2021-10-11 MED ORDER — LIDOCAINE 4 % EX PTCH: 1.0000 | MEDICATED_PATCH | Freq: Two times a day (BID) | CUTANEOUS | 0 refills | Status: AC | PRN

## 2021-10-11 MED ORDER — LIDOCAINE 5 % EX PTCH
1.0000 | MEDICATED_PATCH | CUTANEOUS | Status: DC
  Administered 2021-10-11: 1 via TRANSDERMAL
  Filled 2021-10-11: qty 1

## 2021-10-11 MED ORDER — HYDROCODONE-ACETAMINOPHEN 5-325 MG PO TABS
1.0000 | ORAL_TABLET | Freq: Once | ORAL | Status: AC
  Administered 2021-10-11: 1 via ORAL
  Filled 2021-10-11: qty 1

## 2021-10-11 MED ORDER — CYCLOBENZAPRINE HCL 5 MG PO TABS
5.0000 mg | ORAL_TABLET | Freq: Once | ORAL | Status: AC
  Administered 2021-10-11: 5 mg via ORAL
  Filled 2021-10-11: qty 1

## 2021-10-11 NOTE — ED Provider Notes (Signed)
[facility] EMERGENCY DEPARTMENT Provider Note   CSN: [id number] Arrival date & time: 10/11/21  1611     History Chief Complaint  Patient presents with   Leg Pain    Kelly Kelley is a 55 y.o. female with history of substance use, hypertension, bipolar disorder, borderline personality disorder.  Presents emergency department with a chief complaint of right lumbar back pain.  Starts in lumbar back/buttocks and radiates down to right knee.  Pain has been present over the last week.  Patient rates pain 10/10 on the pain scale.  Pain is worse with standing and ambulation.  Patient has had minimal relief with meloxicam and ibuprofen.  Patient reports that she has a history of sciatica and that this feels similar to previous episode she has had.  Patient states that she has been dealing with sciatica intermittently over the last couple of years.  Patient states that she is followed by her primary care provider for this issue.  Patient denies any recent falls or injuries.  Denies any fevers, chills, bowel or bladder dysfunction, saddle anesthesia, numbness, weakness, facial asymmetry, IV drug use, history of cancer, long-term steroid use.  Unexpected weight loss, night sweats.   Leg Pain Associated symptoms: back pain   Associated symptoms: no fever and no neck pain       Past Medical History:  Diagnosis Date   Anxiety    Asthma    Bipolar disorder (HCC)    Borderline personality disorder (HCC)    Essential hypertension 03/31/2016   Hepatitis C    Insomnia    PTSD (post-traumatic stress disorder)    Substance abuse (HCC)     Patient Active Problem List   Diagnosis Date Noted   HSIL (high grade squamous intraepithelial lesion) on Pap smear of cervix 05/18/2021   Altered mental status    Acute encephalopathy 07/18/2017   Chronic hepatitis C without hepatic coma (HCC) 03/31/2016   Generalized anxiety disorder 03/31/2016   Substance abuse (HCC) 03/31/2016    Insomnia 03/31/2016   GERD (gastroesophageal reflux disease) 03/31/2016   Severe depressed bipolar I disorder (HCC) 03/31/2016   Essential hypertension 03/31/2016    Past Surgical History:  Procedure Laterality Date   CESAREAN SECTION     TUBAL LIGATION       OB History     Gravida  3   Para  3   Term  3   Preterm  0   AB  0   Living  3      SAB  0   IAB  0   Ectopic  0   Multiple  0   Live Births  3           Family History  Problem Relation Age of Onset   Heart attack Father    Hypertension Father    Cancer Father        skin   Cancer Mother        ovarian    Social History   Tobacco Use   Smoking status: Every Day    Packs/day: 1.00    Types: Cigarettes   Smokeless tobacco: Never  Vaping Use   Vaping Use: Never used  Substance Use Topics   Alcohol use: No    Alcohol/week: 0.0 standard drinks   Drug use: Yes    Types: Cocaine    Comment: last use 6 months ago 06/19/20    Home Medications Prior to Admission medications   Medication Sig Start  Date End Date Taking? Authorizing Provider  cloNIDine (CATAPRES) 0.1 MG tablet Take 0.1 mg by mouth 3 (three) times daily as needed. 04/24/21   [provider]  cyclobenzaprine (FLEXERIL) 10 MG tablet Take 1 tablet (10 mg total) by mouth every 8 (eight) hours as needed for muscle spasms. 06/18/21   Federico Flake, MD  divalproex (DEPAKOTE) 500 MG DR tablet Take 1,000 mg by mouth at bedtime. 04/24/21   [provider]  docusate sodium (COLACE) 100 MG capsule Take 100 mg by mouth 2 (two) times daily.    [provider]  doxycycline (VIBRAMYCIN) 100 MG capsule Take 1 capsule (100 mg total) by mouth 2 (two) times daily. 06/18/21   Federico Flake, MD  gabapentin (NEURONTIN) 300 MG capsule Take 1 capsule by mouth 3 (three) times daily. 04/24/21   [provider]  hydrOXYzine (ATARAX/VISTARIL) 25 MG tablet Take 25 mg by mouth at bedtime. 05/14/21   [provider]  meloxicam (MOBIC) 7.5 MG tablet Take 7.5 mg by mouth 2 (two) times daily. 05/14/21   [provider]  pantoprazole (PROTONIX) 40 MG tablet Take 1 tablet by mouth daily. 05/14/21   [provider]  polyethylene glycol (MIRALAX / GLYCOLAX) 17 g packet Take 17 g by mouth daily.    [provider]  triamterene-hydrochlorothiazide (MAXZIDE-25) 37.5-25 MG tablet Take 1 tablet by mouth daily. 05/14/21   [provider]  VITAMIN D, CHOLECALCIFEROL, PO Take 5,000 Units by mouth.    [provider]    Allergies    Morphine and related, Penicillins, and Penicillin g  Review of Systems   Review of Systems  Constitutional:  Negative for chills and fever.  Eyes:  Negative for visual disturbance.  Respiratory:  Negative for shortness of breath.   Cardiovascular:  Negative for chest pain and leg swelling.  Gastrointestinal:  Negative for abdominal pain, nausea and vomiting.  Genitourinary:  Negative for difficulty urinating, dysuria and enuresis.  Musculoskeletal:  Positive for back pain. Negative for neck pain.  Skin:  Negative for color change, pallor, rash and wound.  Neurological:  Negative for dizziness, tremors, seizures, syncope, facial asymmetry, speech difficulty, weakness, light-headedness, numbness and headaches.  Psychiatric/Behavioral:  Negative for confusion.    Physical Exam Updated Vital Signs BP 108/73 (BP Location: Left Arm)   Pulse 72   Temp 98.3 F (36.8 C) (Oral)   Resp 20   Ht 5' (1.524 m)   Wt 73.9 kg   LMP 06/07/2012   SpO2 98%   BMI 31.83 kg/m   Physical Exam Vitals and nursing note reviewed.  Constitutional:      General: She is not in acute distress.    Appearance: She is not ill-appearing, toxic-appearing or diaphoretic.  HENT:     Head: Normocephalic and atraumatic. No raccoon eyes, abrasion, contusion, masses, right periorbital erythema, left periorbital erythema or laceration.  Eyes:     General: No  scleral icterus.       Right eye: No discharge.        Left eye: No discharge.     Extraocular Movements: Extraocular movements intact.     Conjunctiva/sclera: Conjunctivae normal.     Pupils: Pupils are equal, round, and reactive to light.  Cardiovascular:     Rate and Rhythm: Normal rate.  Pulmonary:     Effort: Pulmonary effort is normal.  Abdominal:     Palpations: Abdomen is soft.     Tenderness: There is no abdominal tenderness.  Musculoskeletal:     Cervical back: Normal range of motion and neck supple. No edema, erythema, signs of trauma, rigidity, torticollis or crepitus. No spinous process tenderness or muscular tenderness. Normal range of motion.     Right lower leg: No swelling, deformity, lacerations, tenderness or bony tenderness. No edema.     Left lower leg: No swelling, deformity, lacerations, tenderness or bony tenderness. No edema.     Comments: No midline tenderness or deformity to cervical, thoracic, or lumbar spine.  Pelvis stable.  No leg length discrepancy.  No bony tenderness noted.    Skin:    General: Skin is warm and dry.  Neurological:     General: No focal deficit present.     Mental Status: She is alert.     GCS: GCS eye subscore is 4. GCS verbal subscore is 5. GCS motor subscore is 6.     Cranial Nerves: No cranial nerve deficit or facial asymmetry.     Motor: No weakness, tremor, seizure activity or pronator drift.     Coordination: Finger-Nose-Finger Test normal.     Comments: CN II-XII intact; performed in supine position due to back pain, +5 strength to bilateral upper extremities, +5 strength to dorsiflexion and plantarflexion, patient able to lift left lower leg and holding extremity without difficulty.  Patient able to lift right lower leg but unable to hold against gravity due to complaints of pain.  Sensation to light touch intact to bilateral upper and lower extremities.  Patient able to stand and ambulate with antalgic gait.  Psychiatric:         Behavior: Behavior is cooperative.    ED Results / Procedures / Treatments   Labs (all labs ordered are listed, but only abnormal results are displayed) Labs Reviewed - No data to display  EKG None  Radiology No results found.  Procedures Procedures   Medications Ordered in ED Medications  lidocaine (LIDODERM) 5 % 1 patch (1 patch Transdermal Patch Applied 10/11/21 1853)  predniSONE (DELTASONE) tablet 60 mg (60 mg Oral Given 10/11/21 1851)  cyclobenzaprine (FLEXERIL) tablet 5 mg (5 mg Oral Given 10/11/21 1851)  HYDROcodone-acetaminophen (NORCO/VICODIN) 5-325 MG per tablet 1 tablet (1 tablet Oral Given 10/11/21 1934)    ED Course  I have reviewed the triage vital signs and the nursing notes.  Pertinent labs & imaging results that were available during my care of the patient were reviewed by me and considered in my medical decision making (see chart for details).    MDM Rules/Calculators/A&P                           Alert 55 year old female no acute distress, nontoxic-appearing.  Presents to ED with chief complaint of right lumbar back pain with radiation to right leg.  Patient reports history of sciatica and states this is similar to previous events he has had.  Current episode has lasted over the last week.  No midline tenderness or deformity to cervical, thoracic, or lumbar spine.  Patient is neuro exam is reassuring.  Low suspicion for cauda equina syndrome as patient denies any bowel or bladder dysfunction, saddle anesthesia, numbness, weakness.    Low suspicion for epidural abscess as patient denies any history of IV drug use, fevers, or chills.  Patient denies any history of cancer, long-term steroid use, unexpected weight loss, night sweats.  Positive right leg shortening test.  Suspect that patient is symptoms are likely related to sciatica.  We will give patient prednisone, Flexeril, and lidocaine patch in the emergency department and reassess.  Plan to  discharge patient with short course of these medications as well.    Patient reports improvement in pain however reports increased pain with standing and ambulation.  We will give patient Norco while in the emergency department.  Plan to discharge patient to follow-up with primary care provider.  Discussed results, findings, treatment and follow up. Patient advised of return precautions. Patient verbalized understanding and agreed with plan.   Final Clinical Impression(s) / ED Diagnoses Final diagnoses:  Acute right-sided low back pain with right-sided sciatica    Rx / DC Orders ED Discharge Orders          Ordered    predniSONE (DELTASONE) 20 MG tablet  Daily        10/11/21 1934    cyclobenzaprine (FLEXERIL) 5 MG tablet  3 times daily PRN        10/11/21 1934    Lidocaine (HM LIDOCAINE PATCH) 4 % PTCH  Every 12 hours PRN        10/11/21 1934             Berneice Heinrich 10/11/21 2211    Milagros Loll, MD 10/13/21 2147

## 2021-10-11 NOTE — ED Triage Notes (Signed)
Pt reports pain in right buttock radiating down right leg x 1 month, worse over last week. Tearful in triage. She has been taking meloxicam BID and ibuprofen

## 2021-10-11 NOTE — Discharge Instructions (Addendum)
You came to the emergency department today to be evaluated for your low back pain.  Your physical exam was reassuring.  Your symptoms are likely due to your sciatica.  You will need to follow-up with your primary care provider.  Please take the medication I have prescribed you.  Some common side effects of steroids include feelings of extra energy, feeling warm, increased appetite, and stomach upset.  If you are diabetic your sugars may run higher than usual.  Get help right away if: You are not able to control when you urinate or have bowel movements (incontinence). You have: Weakness in your lower back, pelvis, buttocks, or legs that gets worse. Redness or swelling of your back. A burning sensation when you urinate.

## 2021-10-11 NOTE — ED Notes (Signed)
Patient reports sciatic nerve pain from right buttock down leg.

## 2021-12-23 ENCOUNTER — Other Ambulatory Visit: Payer: Self-pay | Admitting: Endocrinology

## 2021-12-23 DIAGNOSIS — Z1231 Encounter for screening mammogram for malignant neoplasm of breast: Secondary | ICD-10-CM
